# Patient Record
Sex: Female | Born: 1975 | Race: White | Hispanic: No | Marital: Married | Smoking: Former smoker
Health system: Southern US, Community
[De-identification: ages and names within clinical notes are randomized; demographics above are authoritative.]

## PROBLEM LIST (undated history)

## (undated) DIAGNOSIS — K219 Gastro-esophageal reflux disease without esophagitis: Secondary | ICD-10-CM

## (undated) DIAGNOSIS — F319 Bipolar disorder, unspecified: Secondary | ICD-10-CM

## (undated) DIAGNOSIS — J45909 Unspecified asthma, uncomplicated: Secondary | ICD-10-CM

## (undated) DIAGNOSIS — F329 Major depressive disorder, single episode, unspecified: Secondary | ICD-10-CM

## (undated) DIAGNOSIS — F32A Depression, unspecified: Secondary | ICD-10-CM

## (undated) HISTORY — PX: COLONOSCOPY: SHX174

---

## 2005-04-14 ENCOUNTER — Emergency Department: Payer: Self-pay | Admitting: Emergency Medicine

## 2006-06-13 ENCOUNTER — Observation Stay: Payer: Self-pay

## 2006-09-09 ENCOUNTER — Inpatient Hospital Stay: Payer: Self-pay | Admitting: Obstetrics and Gynecology

## 2009-04-17 ENCOUNTER — Emergency Department: Payer: Self-pay | Admitting: Emergency Medicine

## 2010-03-08 ENCOUNTER — Emergency Department: Payer: Self-pay | Admitting: Internal Medicine

## 2010-10-05 ENCOUNTER — Emergency Department: Payer: Self-pay | Admitting: Emergency Medicine

## 2011-09-14 ENCOUNTER — Emergency Department: Payer: Self-pay | Admitting: Emergency Medicine

## 2011-12-19 ENCOUNTER — Emergency Department: Payer: Self-pay | Admitting: Emergency Medicine

## 2012-03-28 ENCOUNTER — Emergency Department: Payer: Self-pay | Admitting: Emergency Medicine

## 2012-03-28 LAB — BASIC METABOLIC PANEL
Anion Gap: 5 — ABNORMAL LOW (ref 7–16)
BUN: 13 mg/dL (ref 7–18)
Co2: 28 mmol/L (ref 21–32)
Potassium: 4 mmol/L (ref 3.5–5.1)
Sodium: 137 mmol/L (ref 136–145)

## 2012-03-28 LAB — URINALYSIS, COMPLETE
Bacteria: NONE SEEN
Bilirubin,UR: NEGATIVE
Glucose,UR: NEGATIVE mg/dL (ref 0–75)
Leukocyte Esterase: NEGATIVE
Protein: NEGATIVE
Squamous Epithelial: 1
WBC UR: 3 /HPF (ref 0–5)

## 2012-03-28 LAB — HCG, QUANTITATIVE, PREGNANCY: Beta Hcg, Quant.: 131467 m[IU]/mL — ABNORMAL HIGH

## 2012-03-28 LAB — CBC
HGB: 12.6 g/dL (ref 12.0–16.0)
MCV: 90 fL (ref 80–100)
RBC: 4.23 10*6/uL (ref 3.80–5.20)
RDW: 13.6 % (ref 11.5–14.5)

## 2012-03-31 ENCOUNTER — Encounter: Payer: Self-pay | Admitting: Obstetrics and Gynecology

## 2012-05-12 ENCOUNTER — Encounter: Payer: Self-pay | Admitting: Obstetrics and Gynecology

## 2012-06-09 ENCOUNTER — Encounter: Payer: Self-pay | Admitting: Obstetrics and Gynecology

## 2012-07-21 ENCOUNTER — Encounter: Payer: Self-pay | Admitting: Obstetrics and Gynecology

## 2012-08-12 ENCOUNTER — Inpatient Hospital Stay: Payer: Self-pay | Admitting: Obstetrics and Gynecology

## 2012-08-12 LAB — DRUG SCREEN, URINE
Barbiturates, Ur Screen: NEGATIVE (ref ?–200)
Cannabinoid 50 Ng, Ur ~~LOC~~: POSITIVE (ref ?–50)
MDMA (Ecstasy)Ur Screen: NEGATIVE (ref ?–500)
Methadone, Ur Screen: NEGATIVE (ref ?–300)
Tricyclic, Ur Screen: NEGATIVE (ref ?–1000)

## 2012-08-12 LAB — CBC WITH DIFFERENTIAL/PLATELET
Basophil #: 0.1 10*3/uL (ref 0.0–0.1)
Basophil %: 0.4 %
Eosinophil #: 0.1 10*3/uL (ref 0.0–0.7)
HGB: 10.8 g/dL — ABNORMAL LOW (ref 12.0–16.0)
Lymphocyte #: 1.7 10*3/uL (ref 1.0–3.6)
MCH: 31.1 pg (ref 26.0–34.0)
MCHC: 34.4 g/dL (ref 32.0–36.0)
MCV: 90 fL (ref 80–100)
Monocyte #: 0.5 x10 3/mm (ref 0.2–0.9)
Neutrophil %: 80.1 %
Platelet: 136 10*3/uL — ABNORMAL LOW (ref 150–440)

## 2012-08-13 LAB — CBC WITH DIFFERENTIAL/PLATELET
Basophil %: 0.1 %
Eosinophil %: 0 %
HCT: 29.6 % — ABNORMAL LOW (ref 35.0–47.0)
HGB: 10.3 g/dL — ABNORMAL LOW (ref 12.0–16.0)
Lymphocyte #: 1.5 10*3/uL (ref 1.0–3.6)
MCH: 31.3 pg (ref 26.0–34.0)
MCV: 90 fL (ref 80–100)
Monocyte #: 0.8 x10 3/mm (ref 0.2–0.9)
Neutrophil #: 15.3 10*3/uL — ABNORMAL HIGH (ref 1.4–6.5)
Platelet: 146 10*3/uL — ABNORMAL LOW (ref 150–440)
RBC: 3.29 10*6/uL — ABNORMAL LOW (ref 3.80–5.20)

## 2012-08-15 LAB — CBC WITH DIFFERENTIAL/PLATELET
Bands: 1 %
Comment - H1-Com2: NORMAL
HGB: 9.3 g/dL — ABNORMAL LOW (ref 12.0–16.0)
MCH: 31.4 pg (ref 26.0–34.0)
MCHC: 34.1 g/dL (ref 32.0–36.0)
Metamyelocyte: 1 %
Monocytes: 7 %
Platelet: 126 10*3/uL — ABNORMAL LOW (ref 150–440)
RBC: 2.98 10*6/uL — ABNORMAL LOW (ref 3.80–5.20)
Segmented Neutrophils: 82 %

## 2012-08-16 ENCOUNTER — Inpatient Hospital Stay: Payer: Self-pay | Admitting: Obstetrics and Gynecology

## 2012-08-16 LAB — DRUG SCREEN, URINE
Amphetamines, Ur Screen: NEGATIVE (ref ?–1000)
Benzodiazepine, Ur Scrn: NEGATIVE (ref ?–200)
Cannabinoid 50 Ng, Ur ~~LOC~~: NEGATIVE (ref ?–50)
MDMA (Ecstasy)Ur Screen: NEGATIVE (ref ?–500)
Methadone, Ur Screen: NEGATIVE (ref ?–300)
Opiate, Ur Screen: NEGATIVE (ref ?–300)
Tricyclic, Ur Screen: NEGATIVE (ref ?–1000)

## 2012-08-16 LAB — CBC WITH DIFFERENTIAL/PLATELET
Basophil %: 0.3 %
Eosinophil #: 0.1 10*3/uL (ref 0.0–0.7)
Eosinophil %: 0.7 %
HCT: 31.7 % — ABNORMAL LOW (ref 35.0–47.0)
Lymphocyte #: 1.9 10*3/uL (ref 1.0–3.6)
MCHC: 35 g/dL (ref 32.0–36.0)
MCV: 91 fL (ref 80–100)
Monocyte #: 0.9 x10 3/mm (ref 0.2–0.9)
Monocyte %: 6.4 %
Neutrophil %: 78.8 %
Platelet: 147 10*3/uL — ABNORMAL LOW (ref 150–440)
RBC: 3.5 10*6/uL — ABNORMAL LOW (ref 3.80–5.20)
RDW: 14.2 % (ref 11.5–14.5)
WBC: 13.7 10*3/uL — ABNORMAL HIGH (ref 3.6–11.0)

## 2012-08-17 LAB — HEMATOCRIT: HCT: 24.8 % — ABNORMAL LOW (ref 35.0–47.0)

## 2013-04-15 ENCOUNTER — Emergency Department: Payer: Self-pay | Admitting: Emergency Medicine

## 2013-04-15 LAB — CBC
HCT: 36.5 % (ref 35.0–47.0)
HGB: 12.9 g/dL (ref 12.0–16.0)
MCHC: 35.3 g/dL (ref 32.0–36.0)
MCV: 84 fL (ref 80–100)
Platelet: 151 10*3/uL (ref 150–440)
RBC: 4.34 10*6/uL (ref 3.80–5.20)
RDW: 13.3 % (ref 11.5–14.5)
WBC: 8.2 10*3/uL (ref 3.6–11.0)

## 2013-04-15 LAB — URINALYSIS, COMPLETE
Bilirubin,UR: NEGATIVE
Blood: NEGATIVE
Ph: 6 (ref 4.5–8.0)
Specific Gravity: 1.016 (ref 1.003–1.030)
WBC UR: <1 /HPF (ref 0–5)

## 2013-04-15 LAB — COMPREHENSIVE METABOLIC PANEL
Albumin: 4.1 g/dL (ref 3.4–5.0)
Alkaline Phosphatase: 50 U/L (ref 50–136)
BUN: 19 mg/dL — ABNORMAL HIGH (ref 7–18)
Bilirubin,Total: 0.4 mg/dL (ref 0.2–1.0)
Calcium, Total: 8.8 mg/dL (ref 8.5–10.1)
EGFR (Non-African Amer.): >60
Glucose: 79 mg/dL (ref 65–99)
Potassium: 3.8 mmol/L (ref 3.5–5.1)
SGOT(AST): 15 U/L (ref 15–37)
SGPT (ALT): 15 U/L (ref 12–78)
Sodium: 140 mmol/L (ref 136–145)

## 2013-04-15 LAB — LIPASE, BLOOD: Lipase: 108 U/L (ref 73–393)

## 2013-05-25 ENCOUNTER — Ambulatory Visit: Payer: Self-pay | Admitting: Gastroenterology

## 2014-06-16 ENCOUNTER — Ambulatory Visit: Payer: Self-pay | Admitting: Physician Assistant

## 2015-01-18 NOTE — Op Note (Signed)
PATIENT NAME:  Kylie Schwartz, Kylie Schwartz MR#:  [id number] DATE OF BIRTH:  06/18/1976  DATE OF PROCEDURE:  08/16/2012  PREOPERATIVE DIAGNOSES:  1. Preterm premature rupture of membranes. 2. Known placenta previa.  3. Recent drug use. 4. Status post steroids this week-- was hospitalized for contractions and spotting after cocaine use.   POSTOPERATIVE DIAGNOSES:  1. Preterm premature rupture of membranes. 2. Known placenta previa.  3. Recent drug use. 4. Status post steroids this week-- was hospitalized for contractions and spotting after cocaine use.   PROCEDURE: Primary low transverse cesarean section.   SURGEON: Ricky Schwartz. [name], M.D.   ASSISTANT: Scrub Tech [facility]([place])   ANESTHESIA: General endotracheal by Dr. Henrene HawkingKephart.   FINDINGS: Grossly normal uterus, tubes, and ovaries.   ESTIMATED BLOOD LOSS: 500 mL.   COMPLICATIONS: None.   SPECIMENS: None.   DRAINS: Foley.   ANTIBIOTICS:  One gram Ancef given IV preoperatively.   PROCEDURE IN DETAIL: The patient presented today with rupture of membranes, minimal spotting. Urgent cesarean section was called. Preoperative antibiotics were given.   NOTE: Discussed the situation with the patient and family. Offered to do tubal ligation free of charge given papers had not been done- the patient initially said she wanted to but then changed her mind with  planned Depo. I discussed the increased chance of hysterectomy and/or excessive blood loss in the setting of placenta previa, anterior. They stated their understanding. Consent was signed.  She was taken to the operating room and placed in the supine position where she was prepped and draped and Foley catheter was inserted. Team was ready. Time-out was done and anesthesia was induced. On okay from anesthesia a rapid cesarean section was carried out in the usual fashion using a #10 blade. A low transverse incision was felt to be possible as it was developed in the lower uterine segment. This was carried out  in the usual fashion cutting through placenta. A forebag was ruptured and the infant delivered without difficulty and handed off to anesthesia for assessment. There was initial vigorous cry. The cord was cut for cord gas. Some cord blood was obtained but was minimal as we had to cut through the placenta to reach the uterine cavity. Asked the crew to try to draw some extra blood out of the cord.   Uterus was exteriorized. The placenta was removed. The uterine cavity was curettaged. It was seen that the transverse incision was more mid uterine given preterm status.  I was still able to close the incision with a single layer running interlocking 0 chromic with some 4-0 Monocryl for surface oozers and a small hematoma on the right. It was observed for several and minutes seen to be hemostatic. Copious fluids were evacuated from the abdominal cavity. The uterus was returned to the abdominal cavity and inspected for hemostasis, which was seen to be excellent. Rectus muscle was hemostatic. Fascia was closed left to right with 0 Vicryl. Subcutaneous was hemostatic with cautery. The skin was closed with surgical clips.   The patient tolerated the procedure well. Sterile dressing was applied. I anticipate a routine postoperative course.   I would recommend repeat cesarean section with any subsequent pregnancies given that although it was transverse incision, it did not appear to be in the "lower uterine segment ".  ____________________________ [name]. [name], MD rle:bjt D: 08/16/2012 12:30:59 ET T: 08/16/2012 14:19:18 ET JOB#: [id number] Adin Lariccia Schwartz Vedha Tercero MD ELECTRONICALLY SIGNED 08/17/2012 3:26

## 2015-01-18 NOTE — Discharge Summary (Signed)
PATIENT NAME:  Kylie Schwartz, Kylie Schwartz MR#:  [id number] DATE OF BIRTH:  11/20/1975  DATE OF ADMISSION:  08/12/2012 DATE OF DISCHARGE:  08/15/2012  HOSPITAL COURSE: The patient is a 39 year old female gravida 7 para 3 at 6531 + 2 weeks admitted with vaginal bleeding and noted to have a placenta previa. The patient had previously engaged in intercourse two days prior and has been actively using drugs, marijuana and cocaine positive on urine drug screen on admission. The patient was admitted and placed on magnesium sulfate for tocolysis, received betamethasone 12.5 mg intramuscular repeated 24 hours later. Hospital day #4 the patient's hematocrit was 27.4%, platelets of 126. The patient had a nonstress test on day of discharge, only some brownish discharge, no contractions, reactive nonstress test. The patient is discharged to home. She has been counseled several times regarding any sexual activity, pelvic rest. She should be at modified activity and is instructed to return to the clinic if she has any significant bleeding, contractions, or abdominal pain. She will be started on ferrous sulfate 325 mg 1 tablet twice a day with Vitamin C. The patient will have nonstress test two times per week at [facility]. She will have a follow-up appointment with [facility].   ____________________________ Suzy Bouchardhomas J. Sophira Rumler, MD tjs:drc D: 08/15/2012 16:23:03 ET T: 08/18/2012 09:45:15 ET JOB#: [id number]  cc: Suzy Bouchardhomas J. Filiberto Wamble, MD, <Dictator> [facility] Suzy BouchardHOMAS J Faige Seely MD ELECTRONICALLY SIGNED 08/19/2012 9:58

## 2015-01-18 NOTE — Discharge Summary (Signed)
PATIENT NAME:  Kylie Schwartz, Kylie Schwartz MR#:  [id number] DATE OF BIRTH:  1976-01-28  DATE OF ADMISSION:  08/16/2012 DATE OF DISCHARGE:  08/19/2012  ADMISSION DIAGNOSIS: Preterm premature rupture of membranes and placenta previa.   DISCHARGE DIAGNOSIS: Preterm premature rupture of membranes and placenta previa.   PROCEDURE: Cesarean section.   COMPLICATIONS: None.   CONSULTANTS: Anesthesia for surgery.   HOSPITAL COURSE: The patient did well, afebrile throughout, and was discharged home on the morning of day three with routine prescriptions, precautions, and follow-up.   The patient was offered tubal ligation and she declined.  ____________________________ [name]. [name], MD rle:slb D: 08/23/2012 10:20:42 ET T: 08/24/2012 13:25:08 ET JOB#: [id number]  cc: [name]. [name], MD, <Dictator> [name] [name] MD ELECTRONICALLY SIGNED 08/25/2012 10:28

## 2015-02-08 NOTE — H&P (Signed)
L&D Evaluation:  History:   HPI 39 yo G7P3 at 31+2 weeks with known placenta previa comes in with BR vag bleeding. . Pt admits to cocaine + MJ use recently in passt 3 days . vag intercourse 2 days ago.    Presents with vaginal bleeding    Patient's Medical History Asthma  drug use    Patient's Surgical History none    Medications Pre Natal Vitamins  albuterol    Allergies NKDA    Social History tobacco  drugs   ROS:   ROS All systems were reviewed.  HEENT, CNS, GI, GU, Respiratory, CV, Renal and Musculoskeletal systems were found to be normal.   Exam:   Vital Signs stable  109/60    Mental Status clear    Chest clear    Heart normal sinus rhythm    Abdomen gravid, non-tender    Pelvic gentle digital: exam cervix closed / 50% blood on glove    FHT normal rate with no decels    Fetal Heart Rate 130    Ucx irritability   Impression:   Impression 31 week with vaginal bleeding and placental previa. reassurring FHR. At risk pregnancy with disregard to intercourse with known previa and drug use recently   Plan:   Plan admit. mag Sulfate 2 gm/ hr. Beta methasone 12.5 IM x 2 q 24hrs. HCT. Social services consult. cont FHM.   Electronic Signatures: Marcelina Mclaurin, Ihor Austinhomas J (MD)  (Signed [phone]-Nov-13 11:53)  Authored: L&D Evaluation   Last Updated: 12-Nov-13 11:53 by Suzy BouchardSchermerhorn, Kentrel Clevenger J (MD)

## 2015-07-27 ENCOUNTER — Other Ambulatory Visit: Payer: Self-pay | Admitting: Psychiatry

## 2015-07-27 DIAGNOSIS — J45909 Unspecified asthma, uncomplicated: Secondary | ICD-10-CM

## 2015-07-27 DIAGNOSIS — F4310 Post-traumatic stress disorder, unspecified: Secondary | ICD-10-CM

## 2015-08-29 ENCOUNTER — Other Ambulatory Visit: Payer: Self-pay | Admitting: Obstetrics and Gynecology

## 2015-08-29 DIAGNOSIS — F4310 Post-traumatic stress disorder, unspecified: Secondary | ICD-10-CM

## 2015-08-29 DIAGNOSIS — J45909 Unspecified asthma, uncomplicated: Secondary | ICD-10-CM

## 2015-08-30 ENCOUNTER — Ambulatory Visit
Admission: RE | Admit: 2015-08-30 | Discharge: 2015-08-30 | Disposition: A | Discharge status: Home / Self Care | Payer: Disability Insurance | Source: Ambulatory Visit | Attending: Obstetrics and Gynecology | Admitting: Obstetrics and Gynecology

## 2015-08-30 ENCOUNTER — Ambulatory Visit: Payer: Disability Insurance

## 2015-08-30 ENCOUNTER — Other Ambulatory Visit: Payer: Self-pay | Admitting: Obstetrics and Gynecology

## 2015-08-30 ENCOUNTER — Ambulatory Visit: Payer: Self-pay

## 2015-08-30 DIAGNOSIS — M549 Dorsalgia, unspecified: Secondary | ICD-10-CM

## 2015-08-30 DIAGNOSIS — M542 Cervicalgia: Secondary | ICD-10-CM | POA: Insufficient documentation

## 2015-09-01 ENCOUNTER — Other Ambulatory Visit: Payer: Self-pay | Admitting: Obstetrics and Gynecology

## 2015-09-01 DIAGNOSIS — J45909 Unspecified asthma, uncomplicated: Secondary | ICD-10-CM

## 2015-09-06 ENCOUNTER — Ambulatory Visit: Payer: Disability Insurance | Attending: Obstetrics and Gynecology

## 2015-09-06 DIAGNOSIS — J45909 Unspecified asthma, uncomplicated: Secondary | ICD-10-CM | POA: Diagnosis not present

## 2016-03-01 ENCOUNTER — Encounter
Admission: RE | Admit: 2016-03-01 | Discharge: 2016-03-01 | Disposition: A | Discharge status: Home / Self Care | Payer: Medicaid Other | Source: Ambulatory Visit | Attending: Podiatry | Admitting: Podiatry

## 2016-03-01 DIAGNOSIS — Z01812 Encounter for preprocedural laboratory examination: Secondary | ICD-10-CM | POA: Insufficient documentation

## 2016-03-01 HISTORY — DX: Unspecified asthma, uncomplicated: J45.909

## 2016-03-01 HISTORY — DX: Bipolar disorder, unspecified: F31.9

## 2016-03-01 HISTORY — DX: Gastro-esophageal reflux disease without esophagitis: K21.9

## 2016-03-01 HISTORY — DX: Major depressive disorder, single episode, unspecified: F32.9

## 2016-03-01 HISTORY — DX: Depression, unspecified: F32.A

## 2016-03-01 LAB — CBC
HCT: 36 % (ref 35.0–47.0)
HEMOGLOBIN: 12.3 g/dL (ref 12.0–16.0)
MCH: 29.3 pg (ref 26.0–34.0)
MCHC: 34.1 g/dL (ref 32.0–36.0)
MCV: 85.9 fL (ref 80.0–100.0)
Platelets: 158 10*3/uL (ref 150–440)
RBC: 4.19 MIL/uL (ref 3.80–5.20)
RDW: 13.7 % (ref 11.5–14.5)
WBC: 7 10*3/uL (ref 3.6–11.0)

## 2016-03-01 LAB — DIFFERENTIAL
BASOS ABS: 0 10*3/uL (ref 0–0.1)
Basophils Relative: 1 %
Eosinophils Absolute: 0.1 10*3/uL (ref 0–0.7)
Eosinophils Relative: 2 %
LYMPHS ABS: 2 10*3/uL (ref 1.0–3.6)
LYMPHS PCT: 29 %
Monocytes Absolute: 0.4 10*3/uL (ref 0.2–0.9)
Monocytes Relative: 5 %
NEUTROS PCT: 63 %
Neutro Abs: 4.4 10*3/uL (ref 1.4–6.5)

## 2016-03-01 NOTE — Patient Instructions (Signed)
Your procedure is scheduled on: Friday 03/09/16 Report to Day Surgery. 2ND FLOOR MEDICAL MALL ENTRANCE To find out your arrival time please call [phone] between 1PM - 3PM on Thursday 03/08/16.  Remember: Instructions that are not followed completely may result in serious medical risk, up to and including death, or upon the discretion of your surgeon and anesthesiologist your surgery may need to be rescheduled.    __X__ 1. Do not eat food or drink liquids after midnight. No gum chewing or hard candies.     __X__ 2. No Alcohol for 24 hours before or after surgery.   ____ 3. Bring all medications with you on the day of surgery if instructed.    __X__ 4. Notify your doctor if there is any change in your medical condition     (cold, fever, infections).     Do not wear jewelry, make-up, hairpins, clips or nail polish.  Do not wear lotions, powders, or perfumes.   Do not shave 48 hours prior to surgery. Men may shave face and neck.  Do not bring valuables to the hospital.    [facility] is not responsible for any belongings or valuables.               Contacts, dentures or bridgework may not be worn into surgery.  Leave your suitcase in the car. After surgery it may be brought to your room.  For patients admitted to the hospital, discharge time is determined by your                treatment team.   Patients discharged the day of surgery will not be allowed to drive home.   Please read over the following fact sheets that you were given:   Surgical Site Infection Prevention   __X__ Take these medicines the morning of surgery with A SIP OF WATER:    1. CLONIDINE  2. BUSPIRONE  3. NEXIUM  4. LAMOTRIGINE  5.  6.  ____ Fleet Enema (as directed)   __X__ Use CHG Soap as directed  __X__ Use inhalers on the day of surgery AND BRING DAY OF SURGERY  ____ Stop metformin 2 days prior to surgery    ____ Take 1/2 of usual insulin dose the night before surgery and none on the morning of  surgery.   ____ Stop Coumadin/Plavix/aspirin on   __X__ Stop Anti-inflammatories on STOP IBUPROFEN UNTIL AFTER SURGERY   ____ Stop supplements until after surgery.    ____ Bring C-Pap to the hospital.

## 2016-03-09 ENCOUNTER — Ambulatory Visit: Payer: Medicaid Other | Admitting: Anesthesiology

## 2016-03-09 ENCOUNTER — Encounter: Payer: Self-pay | Admitting: *Deleted

## 2016-03-09 ENCOUNTER — Encounter
Admission: RE | Disposition: A | Discharge status: Home / Self Care | Payer: Self-pay | Source: Ambulatory Visit | Attending: Podiatry

## 2016-03-09 ENCOUNTER — Ambulatory Visit
Admission: RE | Admit: 2016-03-09 | Discharge: 2016-03-09 | Disposition: A | Discharge status: Home / Self Care | Payer: Medicaid Other | Source: Ambulatory Visit | Attending: Podiatry | Admitting: Podiatry

## 2016-03-09 DIAGNOSIS — Z87891 Personal history of nicotine dependence: Secondary | ICD-10-CM | POA: Insufficient documentation

## 2016-03-09 DIAGNOSIS — K219 Gastro-esophageal reflux disease without esophagitis: Secondary | ICD-10-CM | POA: Insufficient documentation

## 2016-03-09 DIAGNOSIS — S92421P Displaced fracture of distal phalanx of right great toe, subsequent encounter for fracture with malunion: Secondary | ICD-10-CM | POA: Insufficient documentation

## 2016-03-09 DIAGNOSIS — F319 Bipolar disorder, unspecified: Secondary | ICD-10-CM | POA: Diagnosis not present

## 2016-03-09 DIAGNOSIS — F329 Major depressive disorder, single episode, unspecified: Secondary | ICD-10-CM | POA: Insufficient documentation

## 2016-03-09 DIAGNOSIS — J45909 Unspecified asthma, uncomplicated: Secondary | ICD-10-CM | POA: Diagnosis not present

## 2016-03-09 DIAGNOSIS — S92911A Unspecified fracture of right toe(s), initial encounter for closed fracture: Secondary | ICD-10-CM | POA: Diagnosis present

## 2016-03-09 HISTORY — PX: OSTECTOMY: SHX6439

## 2016-03-09 LAB — POCT PREGNANCY, URINE: Preg Test, Ur: NEGATIVE

## 2016-03-09 SURGERY — OSTECTOMY
Anesthesia: General | Site: Foot | Laterality: Right | Wound class: Clean

## 2016-03-09 MED ORDER — BUPIVACAINE HCL (PF) 0.5 % IJ SOLN
INTRAMUSCULAR | Status: DC | PRN
  Administered 2016-03-09: 5 mL

## 2016-03-09 MED ORDER — BUPIVACAINE HCL (PF) 0.5 % IJ SOLN
INTRAMUSCULAR | Status: AC
  Filled 2016-03-09: qty 30

## 2016-03-09 MED ORDER — FENTANYL CITRATE (PF) 100 MCG/2ML IJ SOLN: 25.0000 ug | INTRAMUSCULAR | Status: DC | PRN

## 2016-03-09 MED ORDER — LIDOCAINE HCL (PF) 1 % IJ SOLN
INTRAMUSCULAR | Status: AC
  Filled 2016-03-09: qty 30

## 2016-03-09 MED ORDER — MIDAZOLAM HCL 2 MG/2ML IJ SOLN
INTRAMUSCULAR | Status: DC | PRN
  Administered 2016-03-09: 2 mg via INTRAVENOUS

## 2016-03-09 MED ORDER — LACTATED RINGERS IV SOLN
INTRAVENOUS | Status: DC
  Administered 2016-03-09: 09:00:00 via INTRAVENOUS

## 2016-03-09 MED ORDER — PROPOFOL 500 MG/50ML IV EMUL
INTRAVENOUS | Status: DC | PRN
  Administered 2016-03-09: 75 ug/kg/min via INTRAVENOUS

## 2016-03-09 MED ORDER — ACETAMINOPHEN 10 MG/ML IV SOLN
INTRAVENOUS | Status: DC | PRN
  Administered 2016-03-09: 1000 mg via INTRAVENOUS

## 2016-03-09 MED ORDER — FENTANYL CITRATE (PF) 100 MCG/2ML IJ SOLN
INTRAMUSCULAR | Status: DC | PRN
  Administered 2016-03-09 (×4): 25 ug via INTRAVENOUS

## 2016-03-09 MED ORDER — ONDANSETRON HCL 4 MG/2ML IJ SOLN
INTRAMUSCULAR | Status: DC | PRN
  Administered 2016-03-09: 4 mg via INTRAVENOUS

## 2016-03-09 MED ORDER — HYDROCODONE-ACETAMINOPHEN 5-325 MG PO TABS: 1.0000 | ORAL_TABLET | ORAL | Status: AC | PRN

## 2016-03-09 MED ORDER — DEXAMETHASONE SODIUM PHOSPHATE 10 MG/ML IJ SOLN
INTRAMUSCULAR | Status: DC | PRN
  Administered 2016-03-09: 10 mg via INTRAVENOUS

## 2016-03-09 MED ORDER — LIDOCAINE HCL (CARDIAC) 20 MG/ML IV SOLN
INTRAVENOUS | Status: DC | PRN
  Administered 2016-03-09: 50 mg via INTRAVENOUS

## 2016-03-09 MED ORDER — OXYCODONE HCL 5 MG/5ML PO SOLN: 5.0000 mg | Freq: Once | ORAL | Status: DC | PRN

## 2016-03-09 MED ORDER — PROPOFOL 10 MG/ML IV BOLUS
INTRAVENOUS | Status: DC | PRN
  Administered 2016-03-09: 10 mg via INTRAVENOUS
  Administered 2016-03-09: 20 mg via INTRAVENOUS
  Administered 2016-03-09: 10 mg via INTRAVENOUS

## 2016-03-09 MED ORDER — ACETAMINOPHEN 10 MG/ML IV SOLN
INTRAVENOUS | Status: AC
  Filled 2016-03-09: qty 100

## 2016-03-09 MED ORDER — OXYCODONE HCL 5 MG PO TABS: 5.0000 mg | ORAL_TABLET | Freq: Once | ORAL | Status: DC | PRN

## 2016-03-09 SURGICAL SUPPLY — 31 items
BAG COUNTER SPONGE EZ (MISCELLANEOUS) IMPLANT
BANDAGE STRETCH 3X4.1 STRL (GAUZE/BANDAGES/DRESSINGS) ×3 IMPLANT
BNDG ESMARK 4X12 TAN STRL LF (GAUZE/BANDAGES/DRESSINGS) ×3 IMPLANT
BNDG GAUZE 4.5X4.1 6PLY STRL (MISCELLANEOUS) ×3 IMPLANT
CLOSURE WOUND 1/4X4 (GAUZE/BANDAGES/DRESSINGS) ×1
COUNTER SPONGE BAG EZ (MISCELLANEOUS)
CUFF TOURN DUAL PL 12 NO SLV (MISCELLANEOUS) ×3 IMPLANT
DRAPE FLUOR MINI C-ARM 54X84 (DRAPES) ×3 IMPLANT
DRSG TEGADERM 4X4.75 (GAUZE/BANDAGES/DRESSINGS) IMPLANT
DURAPREP 26ML APPLICATOR (WOUND CARE) ×3 IMPLANT
ELECT REM PT RETURN 9FT ADLT (ELECTROSURGICAL) ×3
ELECTRODE REM PT RTRN 9FT ADLT (ELECTROSURGICAL) ×1 IMPLANT
GAUZE PETRO XEROFOAM 1X8 (MISCELLANEOUS) ×3 IMPLANT
GAUZE SPONGE 4X4 12PLY STRL (GAUZE/BANDAGES/DRESSINGS) ×3 IMPLANT
GAUZE STRETCH 2X75IN STRL (MISCELLANEOUS) ×3 IMPLANT
GLOVE BIO SURGEON STRL SZ7.5 (GLOVE) ×3 IMPLANT
GLOVE INDICATOR 8.0 STRL GRN (GLOVE) ×3 IMPLANT
GOWN STRL REUS W/ TWL LRG LVL3 (GOWN DISPOSABLE) ×1 IMPLANT
GOWN STRL REUS W/TWL LRG LVL3 (GOWN DISPOSABLE) ×2
KIT RM TURNOVER STRD PROC AR (KITS) ×3 IMPLANT
LABEL OR SOLS (LABEL) ×3 IMPLANT
NDL SAFETY 18GX1.5 (NEEDLE) ×3 IMPLANT
NEEDLE HYPO 25X1 1.5 SAFETY (NEEDLE) ×6 IMPLANT
NS IRRIG 500ML POUR BTL (IV SOLUTION) ×3 IMPLANT
PACK EXTREMITY ARMC (MISCELLANEOUS) ×3 IMPLANT
PENCIL ELECTRO HAND CTR (MISCELLANEOUS) ×3 IMPLANT
STOCKINETTE M/LG 89821 (MISCELLANEOUS) ×3 IMPLANT
STRIP CLOSURE SKIN 1/4X4 (GAUZE/BANDAGES/DRESSINGS) ×2 IMPLANT
SURGICAL BLADE HAVEL'S ×3 IMPLANT
SUT VIC AB 4-0 FS2 27 (SUTURE) ×3 IMPLANT
SUT VICRYL AB 3-0 FS1 BRD 27IN (SUTURE) IMPLANT

## 2016-03-09 NOTE — Interval H&P Note (Signed)
History and Physical Interval Note:  03/09/2016 9:15 AM  Ranae L Genella RifeGuerrero  has presented today for surgery, with the diagnosis of s92.4215  The various methods of treatment have been discussed with the patient and family. After consideration of risks, benefits and other options for treatment, the patient has consented to  Procedure(s): OSTECTOMY/1st great toe (Right) as a surgical intervention .  The patient's history has been reviewed, patient examined, no change in status, stable for surgery.  I have reviewed the patient's chart and labs.  Questions were answered to the patient's satisfaction.     Ricci Barkerodd W Meigan Pates

## 2016-03-09 NOTE — Transfer of Care (Signed)
Immediate Anesthesia Transfer of Care Note  Patient: Kylie Schwartz  Procedure(s) Performed: Procedure(s): OSTECTOMY/1st great toe (Right)  Patient Location: PACU  Anesthesia Type:General  Level of Consciousness: awake, alert  and oriented  Airway & Oxygen Therapy: Patient Spontanous Breathing  Post-op Assessment: Report given to RN and Post -op Vital signs reviewed and stable  Post vital signs: Reviewed and stable  Last Vitals:  Filed Vitals:   03/09/16 0840 03/09/16 1017  BP: 94/59 96/63  Pulse: 74 74  Temp: 36.9 C 36.8 C  Resp: 18 13    Last Pain: There were no vitals filed for this visit.       Complications: No apparent anesthesia complications

## 2016-03-09 NOTE — Anesthesia Procedure Notes (Signed)
Date/Time: 03/09/2016 9:28 AM Performed by: Ginger CarneMICHELET, Laurren Lepkowski Pre-anesthesia Checklist: Patient identified, Emergency Drugs available, Suction available, Patient being monitored and Timeout performed Patient Re-evaluated:Patient Re-evaluated prior to inductionOxygen Delivery Method: Simple face mask Preoxygenation: Pre-oxygenation with 100% oxygen

## 2016-03-09 NOTE — Discharge Instructions (Addendum)
1. Elevate the right lower extremity on 2 pillows.  2. Keep the bandage on the right foot clean, dry, and do not remove.  3. Sponge bathe only right lower extremity.  4. Wear surgical shoe on the right foot whenever walking or standing.  5. Take one pain pill, Norco, every 4 hours as needed for pain.  AMBULATORY SURGERY  DISCHARGE INSTRUCTIONS   1) The drugs that you were given will stay in your system until tomorrow so for the next 24 hours you should not:  A) Drive an automobile B) Make any legal decisions C) Drink any alcoholic beverage   2) You may resume regular meals tomorrow.  Today it is better to start with liquids and gradually work up to solid foods.  You may eat anything you prefer, but it is better to start with liquids, then soup and crackers, and gradually work up to solid foods.   3) Please notify your doctor immediately if you have any unusual bleeding, trouble breathing, redness and pain at the surgery site, drainage, fever, or pain not relieved by medication.    4) Additional Instructions:        Please contact your physician with any problems or Same Day Surgery at [phone], Monday through Friday 6 am to 4 pm, or [place] [facility] Main number at [phone].

## 2016-03-09 NOTE — Anesthesia Preprocedure Evaluation (Signed)
Anesthesia Evaluation  Patient identified by MRN, date of birth, ID band Patient awake    Reviewed: Allergy & Precautions, H&P , NPO status , Patient's Chart, lab work & pertinent test results  History of Anesthesia Complications Negative for: history of anesthetic complications  Airway Mallampati: III  TM Distance: >3 FB Neck ROM: full    Dental  (+) Poor Dentition, Missing, Chipped, Partial Upper   Pulmonary neg shortness of breath, asthma , former smoker,    Pulmonary exam normal breath sounds clear to auscultation       Cardiovascular Exercise Tolerance: Good (-) angina(-) Past MI and (-) DOE negative cardio ROS Normal cardiovascular exam Rhythm:regular Rate:Normal     Neuro/Psych PSYCHIATRIC DISORDERS Depression Bipolar Disorder negative neurological ROS     GI/Hepatic Neg liver ROS, GERD  Controlled,  Endo/Other  negative endocrine ROS  Renal/GU negative Renal ROS  negative genitourinary   Musculoskeletal   Abdominal   Peds  Hematology negative hematology ROS (+)   Anesthesia Other Findings Past Medical History:   Bipolar disorder (HCC)                                       Asthma                                                       GERD (gastroesophageal reflux disease)                       Depression                                                  Past Surgical History:   CESAREAN SECTION                                              COLONOSCOPY                                                   CESAREAN SECTION                                                Reproductive/Obstetrics negative OB ROS                             Anesthesia Physical Anesthesia Plan  ASA: III  Anesthesia Plan: General   Post-op Pain Management:    Induction:   Airway Management Planned:   Additional Equipment:   Intra-op Plan:   Post-operative Plan:   Informed Consent: I have  reviewed the patients History and Physical, chart, labs and discussed the procedure including the risks, benefits and alternatives for the proposed  anesthesia with the patient or authorized representative who has indicated his/her understanding and acceptance.   Dental Advisory Given  Plan Discussed with: Anesthesiologist, CRNA and Surgeon  Anesthesia Plan Comments:         Anesthesia Quick Evaluation

## 2016-03-09 NOTE — Op Note (Signed)
Date of operation: 03/09/2016.  Surgeon: Ricci Barkerodd W Kenyan Karnes DPM.  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  Postoperative diagnosis: Same.  Procedure: Excision fracture fragment distal phalanx right hallux.  Anesthesia: Local Mac.  Hemostasis: Pneumatic tourniquet right ankle 250 mmHg.  Estimated blood loss: Less than 5 cc.  Pathology: None.  Materials: None.   Patient's: None apparent.  Operative indications: This is a 40 year old female with a history of a fracture to her right great toe. She has continued to have pain in the joint with a malunion of the fracture and she elects to have surgical removal.  Operative procedure: Patient was taken to the operating room and placed on the table in the supine position. Going satisfactory sedation the right great toe was anesthetized with 5 cc of 0.5% Sensorcaine plain. A pneumatic tourniquet was applied at the level of the right ankle and the foot was prepped and draped in the usual sterile fashion. The foot was exsanguinated and the tourniquet inflated to 250 mmHg. Attention was directed to the dorsal aspect of the right great toe where an approximate 2.5 cm linear incision was made coursing proximal to distal over the interphalangeal joint. The incision was deepened via sharp and blunt dissection down to the level of the bone where a linear periosteal and capsular incision was made. Capsular periosteal tissues reflected off of the head of the proximal phalanx and base of the distal phalanx. The prominent dorsal bone was identified. It was fairly well healed to the main body of the bone. The fragment was then removed using a ronguer to remove the dorsal base of the distal phalanx. The remaining raw bony edges were rasped smooth. On range of motion there was no recurrent crepitus noted. Intraoperative FluoroScan views revealed good reduction of the bony deformity. The wound was flushed with copious amounts of  sterile saline and closed using 4-0 Vicryl running suture for all layers from capsular and periosteal closure to deep and superficial subcutaneous closure and skin closure. Tincture of benzoin and Steri-Strips applied followed by Xeroform and a sterile bandage. Tourniquet released and blood flow noted to return immediately to the right foot and all digits. The patient tolerated the procedure and anesthesia well and was transported to the PACU with vital signs stable and in good condition.

## 2016-03-09 NOTE — H&P (Signed)
  Medical history physical and chart was reviewed. No interval changes. Patient stable for surgery

## 2016-03-09 NOTE — Anesthesia Postprocedure Evaluation (Signed)
Anesthesia Post Note  Patient: Kylie Schwartz  Procedure(s) Performed: Procedure(s) (LRB): OSTECTOMY/1st great toe (Right)  Patient location during evaluation: PACU Anesthesia Type: General Level of consciousness: awake and alert Pain management: pain level controlled Vital Signs Assessment: post-procedure vital signs reviewed and stable Respiratory status: spontaneous breathing, nonlabored ventilation, respiratory function stable and patient connected to nasal cannula oxygen Cardiovascular status: blood pressure returned to baseline and stable Postop Assessment: no signs of nausea or vomiting Anesthetic complications: no    Last Vitals:  Filed Vitals:   03/09/16 1047 03/09/16 1058  BP: 102/69 96/61  Pulse: 66 60  Temp: 36.6 C 35.9 C  Resp: 12 14    Last Pain:  Filed Vitals:   03/09/16 1058  PainSc: 0-No pain                 Jomarie LongsJoseph K Marquies Wanat

## 2016-11-05 IMAGING — CR DG LUMBAR SPINE 2-3V
3 series · 3 of 3 positions shown · non-contrast
Comparison: None

CLINICAL DATA: Back pain, injured lifting 2 years ago, re-injured 2
days ago,

EXAM:
LUMBAR SPINE - 2-3 VIEW

[l-spine ap]
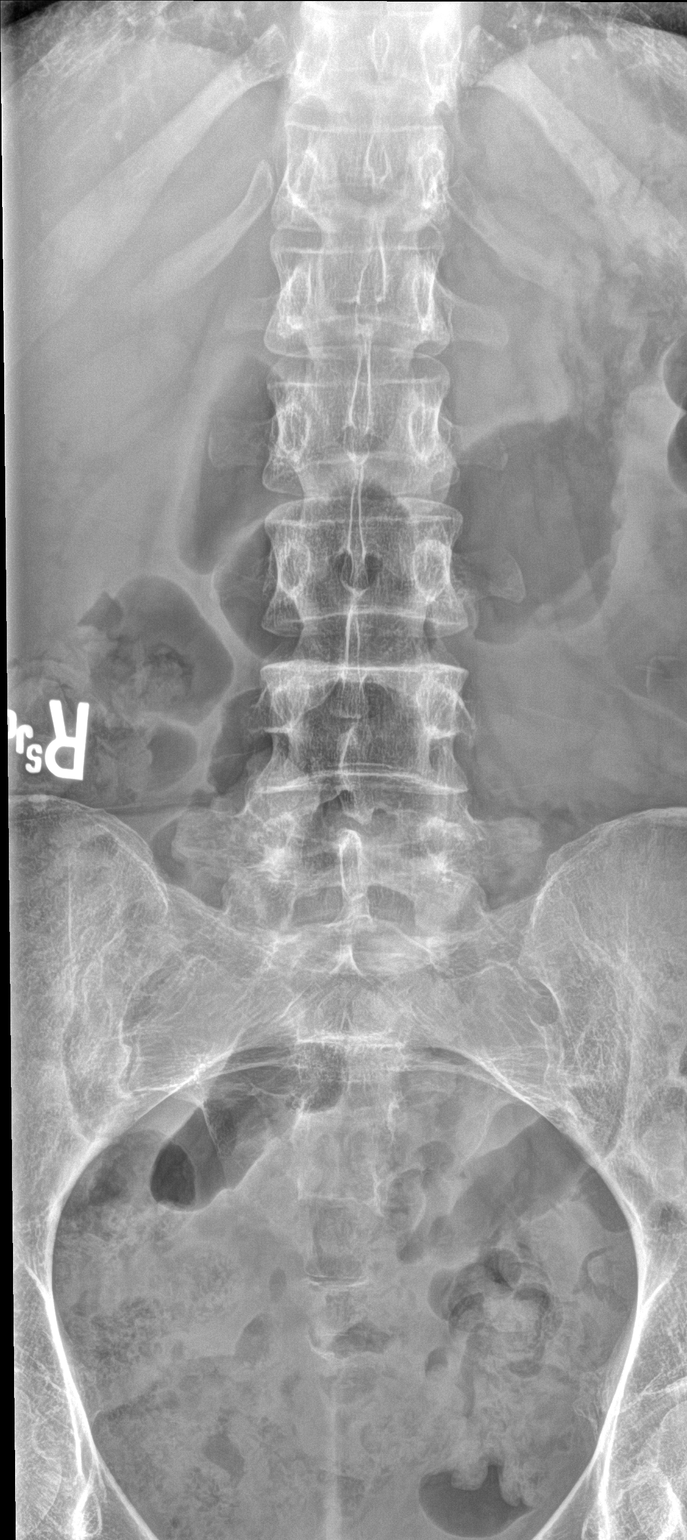

[l-spine lat]
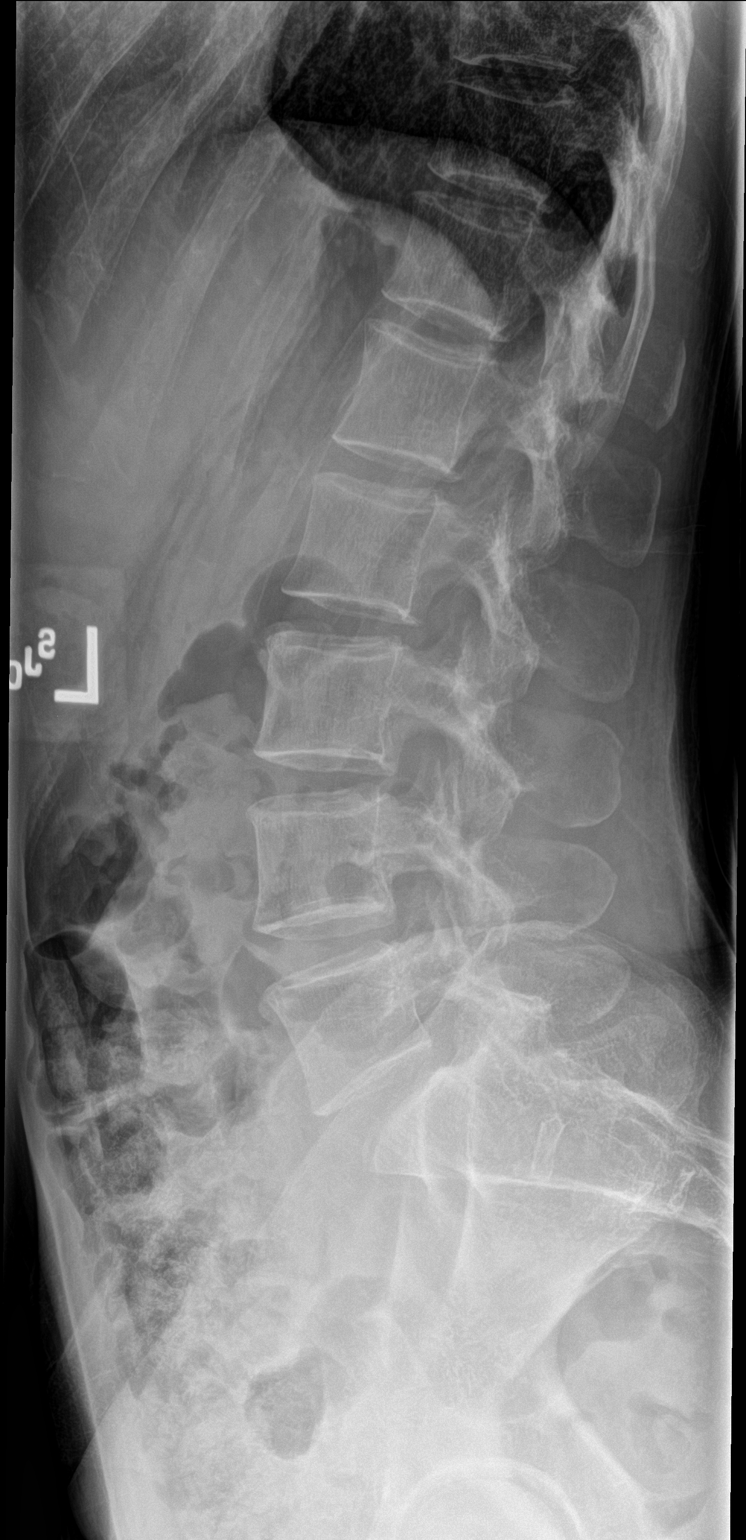

[l-spine spot]
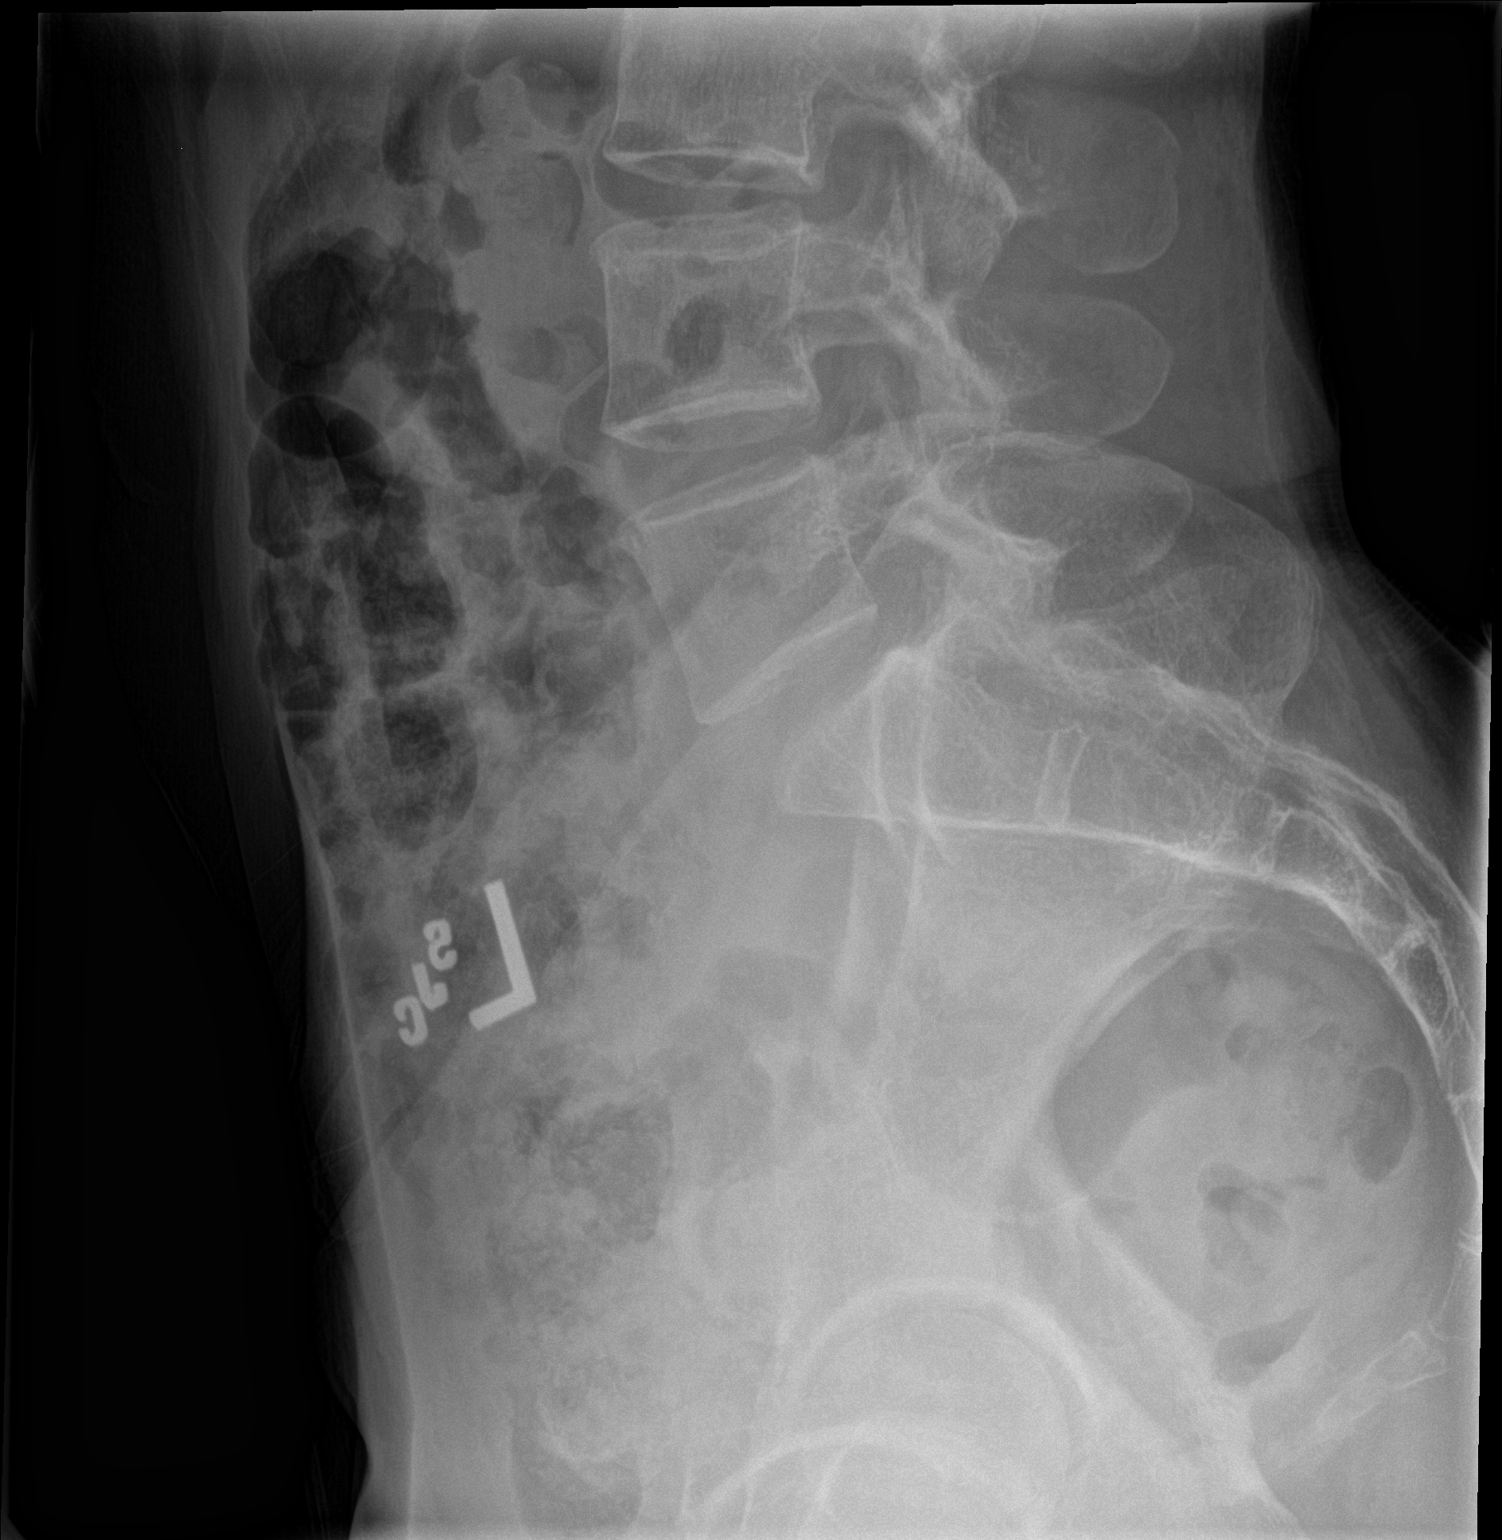

[3 of 3 positions shown; findings below may reference images not displayed]

FINDINGS: The lumbar vertebrae are in normal alignment. Intervertebral disc
spaces appear normal. No compression deformity is seen. No
significant degenerative change is noted. The SI joints are well
corticated.
IMPRESSION: Negative.

## 2016-11-05 IMAGING — CR DG THORACIC SPINE 2V
2 series · 2 of 2 positions shown · non-contrast
Comparison: None.

CLINICAL DATA: Back pain, injured 2 years ago lifting, re-injured
recently

EXAM:
THORACIC SPINE 2 VIEWS

[t-spine ap]
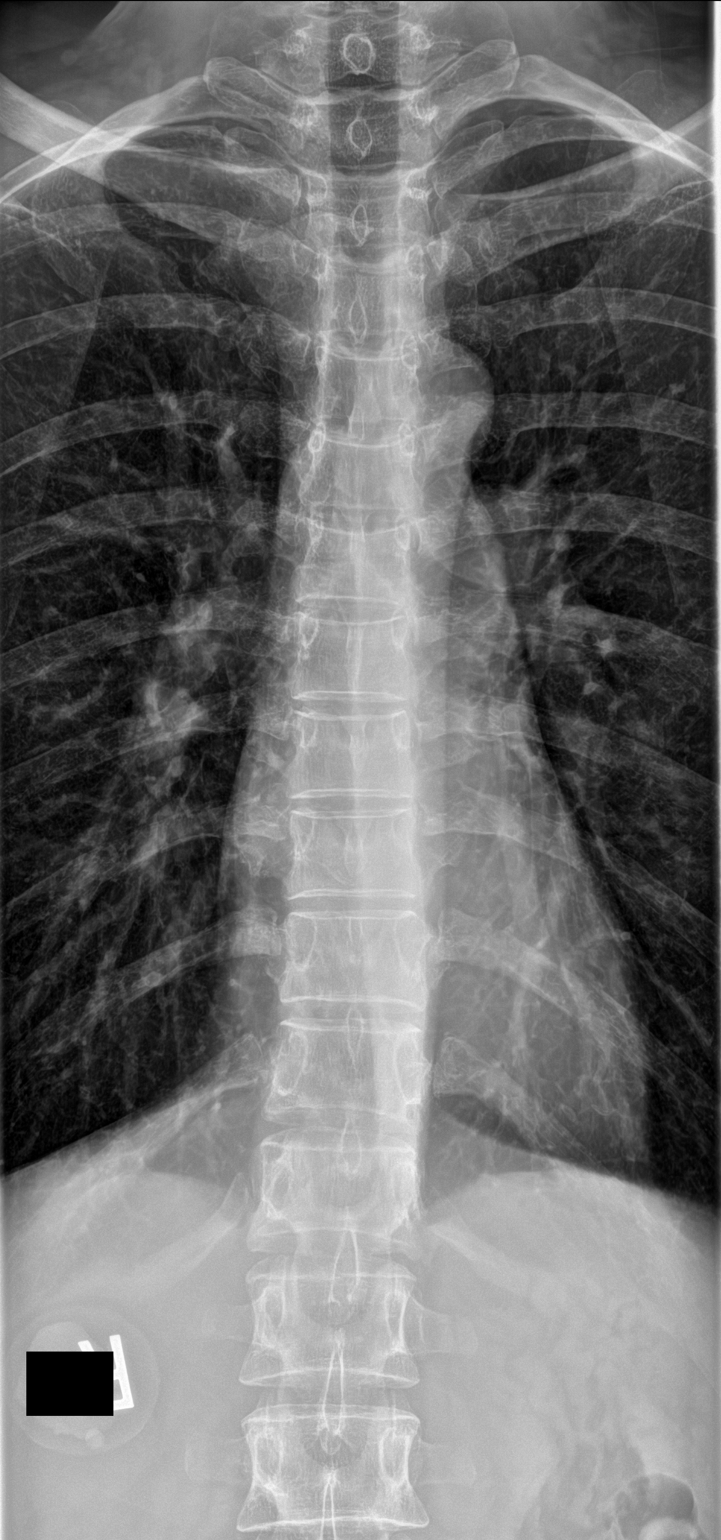

[t-spine lat]
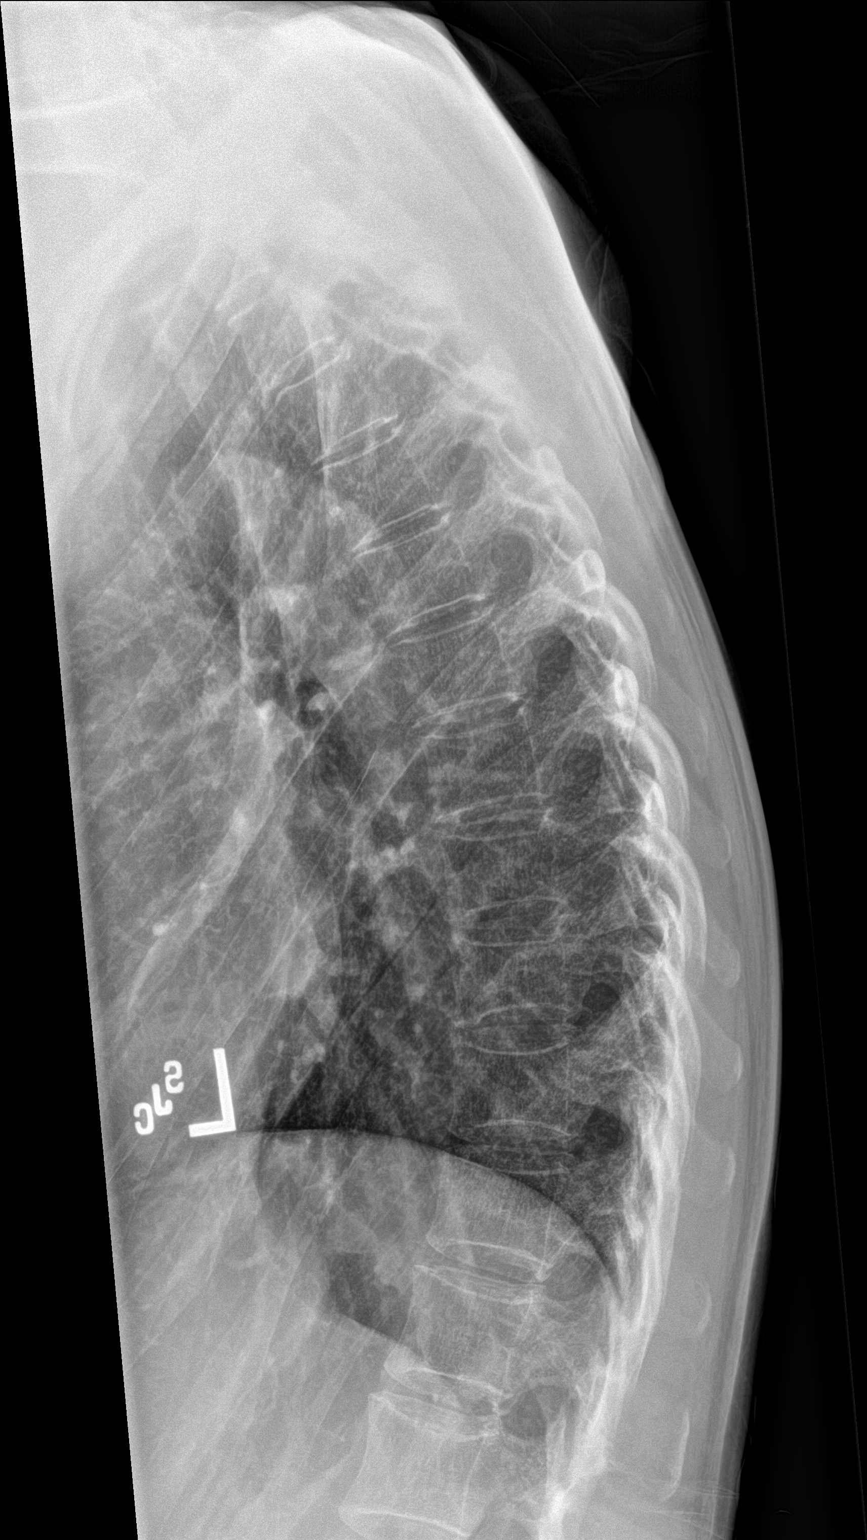

[2 of 2 positions shown; findings below may reference images not displayed]

FINDINGS: The thoracic vertebrae are in normal alignment. Intervertebral disc
spaces appear normal. No compression deformity is seen. No prominent
paravertebral soft tissue is noted.
IMPRESSION: Negative.

## 2017-10-23 ENCOUNTER — Encounter: Payer: Self-pay | Admitting: Emergency Medicine

## 2017-10-23 ENCOUNTER — Other Ambulatory Visit: Payer: Self-pay

## 2017-10-23 ENCOUNTER — Emergency Department
Admission: EM | Admit: 2017-10-23 | Discharge: 2017-10-23 | Disposition: A | Discharge status: Home / Self Care | Payer: Medicaid Other | Attending: Emergency Medicine | Admitting: Emergency Medicine

## 2017-10-23 DIAGNOSIS — F17210 Nicotine dependence, cigarettes, uncomplicated: Secondary | ICD-10-CM | POA: Diagnosis not present

## 2017-10-23 DIAGNOSIS — Z79899 Other long term (current) drug therapy: Secondary | ICD-10-CM | POA: Insufficient documentation

## 2017-10-23 DIAGNOSIS — L03211 Cellulitis of face: Secondary | ICD-10-CM | POA: Diagnosis not present

## 2017-10-23 DIAGNOSIS — R220 Localized swelling, mass and lump, head: Secondary | ICD-10-CM | POA: Diagnosis present

## 2017-10-23 DIAGNOSIS — J45909 Unspecified asthma, uncomplicated: Secondary | ICD-10-CM | POA: Insufficient documentation

## 2017-10-23 MED ORDER — IBUPROFEN 600 MG PO TABS
600.0000 mg | ORAL_TABLET | Freq: Once | ORAL | Status: AC
  Administered 2017-10-23: 600 mg via ORAL
  Filled 2017-10-23: qty 1

## 2017-10-23 MED ORDER — CLINDAMYCIN HCL 300 MG PO CAPS: 300.0000 mg | ORAL_CAPSULE | Freq: Four times a day (QID) | ORAL | 0 refills | Status: AC

## 2017-10-23 MED ORDER — CLINDAMYCIN HCL 150 MG PO CAPS
300.0000 mg | ORAL_CAPSULE | Freq: Once | ORAL | Status: AC
  Administered 2017-10-23: 300 mg via ORAL
  Filled 2017-10-23: qty 2

## 2017-10-23 NOTE — ED Provider Notes (Addendum)
[facility] Emergency Department Provider Note  Time seen: 11:33 AM  I have reviewed the triage vital signs and the nursing notes.   HISTORY  Chief Complaint Facial Swelling    HPI Kylie Schwartz is a 42 y.o. female with a past medical history of bipolar, gastric reflux, asthma, presents to the emergency department for facial swelling and 2 bumps.  Patient states Sunday morning she noticed 2 bumps to her forehead which were tender and a little swollen.  She went to her doctor Monday and was prescribed valacyclovir for possible shingles.  Has been taking the valacyclovir but today noticed that the 2 bumps on her forehead were slightly more swollen and tender and she had a mild amount of swelling around her nose as well.  Denies any fever.  Negative review of systems including vomiting.  Patient was concerned given the increase in swelling so she came to the emergency department for evaluation.   Past Medical History:  Diagnosis Date  . Asthma   . Bipolar disorder (HCC)   . Depression   . GERD (gastroesophageal reflux disease)     There are no active problems to display for this patient.   Past Surgical History:  Procedure Laterality Date  . CESAREAN SECTION    . CESAREAN SECTION    . COLONOSCOPY    . OSTECTOMY Right 03/09/2016   Procedure: OSTECTOMY/1st great toe;  Surgeon: Linus Galasodd Cline, DPM;  Location: [facility] ORS;  Service: Podiatry;  Laterality: Right;    Prior to Admission medications   Medication Sig Start Date End Date Taking? Authorizing Provider  albuterol (PROAIR HFA) 108 (90 Base) MCG/ACT inhaler Inhale 2 puffs into the lungs every 6 (six) hours as needed for wheezing or shortness of breath.    [provider]  beclomethasone (QVAR) 80 MCG/ACT inhaler Inhale 1 puff into the lungs 2 (two) times daily.    [provider]  busPIRone (BUSPAR) 15 MG tablet Take 15 mg by mouth 2 (two) times daily.    [provider]  cloNIDine  (CATAPRES) 0.1 MG tablet Take 0.1 mg by mouth 2 (two) times daily.    [provider]  esomeprazole (NEXIUM) 40 MG capsule Take 40 mg by mouth daily.    [provider]  HYDROcodone-acetaminophen (NORCO) 5-325 MG tablet Take 1-2 tablets by mouth every 4 (four) hours as needed for moderate pain. 03/09/16   Linus Galasline, Todd, DPM  ibuprofen (ADVIL,MOTRIN) 800 MG tablet Take 800 mg by mouth every 8 (eight) hours as needed.    [provider]  lamoTRIgine (LAMICTAL) 25 MG tablet Take 25 mg by mouth 4 (four) times daily.    [provider]  medroxyPROGESTERone (DEPO-PROVERA) 150 MG/ML injection Inject 150 mg into the muscle every 3 (three) months.    [provider]    No Known Allergies  History reviewed. No pertinent family history.  Social History Social History   Tobacco Use  . Smoking status: Current Some Day Smoker  . Smokeless tobacco: Never Used  Substance Use Topics  . Alcohol use: No  . Drug use: No    Review of Systems Constitutional: Negative for fever. Eyes: Negative for visual complaints.  Mild swelling around her nose. ENT: Negative for recent illness/congestion Cardiovascular: Negative for chest pain. Respiratory: Negative for shortness of breath. Gastrointestinal: Negative for abdominal pain, vomiting Genitourinary: Negative for urinary compaints Musculoskeletal: Negative for musculoskeletal complaints Skin: 2 bumps with tenderness and swelling to her forehead. Neurological: Mild headache. All  other ROS negative  ____________________________________________   PHYSICAL EXAM:  VITAL SIGNS: ED Triage Vitals  Enc Vitals Group     BP 10/23/17 1026 112/77     Pulse Rate 10/23/17 1026 78     Resp --      Temp 10/23/17 1026 97.9 F (36.6 C)     Temp Source 10/23/17 1026 Oral     SpO2 10/23/17 1026 98 %     Weight 10/23/17 1027 167 lb (75.8 kg)     Height 10/23/17 1027 5\' 7"  (1.702 m)     Head Circumference --      Peak  Flow --      Pain Score 10/23/17 1026 9     Pain Loc --      Pain Edu? --      Excl. in GC? --    Constitutional: Alert and oriented. Well appearing and in no distress. Eyes: Normal exam ENT   Head: Patient has 2 bumps to the her central to left forehead.  Appear to be more consistent with pimples/follicle inflammation.  No other rash identified.  No vesicular rash.  No skin tenderness.  Normal tympanic membrane.   Mouth/Throat: Mucous membranes are moist. Cardiovascular: Normal rate, regular rhythm. No murmur Respiratory: Normal respiratory effort without tachypnea nor retractions. Breath sounds are clear  Gastrointestinal: Soft and nontender. No distention. Musculoskeletal: Nontender with normal range of motion in all extremities.  Neurologic:  Normal speech and language. No gross focal neurologic deficits  Skin:  Skin is warm, dry.  2 small bumps as described above. Psychiatric: Mood and affect are normal  ____________________________________________   INITIAL IMPRESSION / ASSESSMENT AND PLAN / ED COURSE  Pertinent labs & imaging results that were available during my care of the patient were reviewed by me and considered in my medical decision making (see chart for details).  Patient presents to the emergency department with very slight swelling to her face and tenderness around 2 bumps to her central to left forehead.  Differential would include shingles, cellulitis, pimples.  Exam is more consistent with cellulitis.  No other rash identified.  No vesicular rash.  No dermatomal rash.  I discussed with the patient discontinuing valacyclovir and instead starting clindamycin.  Patient has very slight swelling around these 2 bumps with very minimal swelling extending to the bridge of her nose.  No visual changes.  No tenderness to this area.  I discussed using ice packs to the face for up to 10-20 minutes every several hours for comfort and to reduce swelling.  I also discussed  beginning clindamycin today taking it 4 times daily with follow-up in 2 days with her primary care doctor for recheck.  I also discussed return precautions for fever, vomiting or significant increase in swelling or pain.  ____________________________________________   FINAL CLINICAL IMPRESSION(S) / ED DIAGNOSES  Cellulitis    Minna Antis, MD 10/23/17 1142    Minna Antis, MD 10/23/17 [phone]

## 2017-10-23 NOTE — ED Triage Notes (Signed)
Pt to triage with two bumps on left forehead that started Sunday and were sore and itching. Went to PCP yesterday and was given valacyclovir for possible shingles. Woke up this am with left nose and cheek swelling.

## 2019-12-10 ENCOUNTER — Other Ambulatory Visit: Payer: Self-pay | Admitting: Physician Assistant

## 2019-12-10 DIAGNOSIS — Z1231 Encounter for screening mammogram for malignant neoplasm of breast: Secondary | ICD-10-CM

## 2019-12-16 ENCOUNTER — Ambulatory Visit
Admission: RE | Admit: 2019-12-16 | Discharge: 2019-12-16 | Disposition: A | Discharge status: Home / Self Care | Payer: Medicaid Other | Source: Ambulatory Visit | Attending: Physician Assistant | Admitting: Physician Assistant

## 2019-12-16 DIAGNOSIS — Z1231 Encounter for screening mammogram for malignant neoplasm of breast: Secondary | ICD-10-CM | POA: Diagnosis present

## 2020-04-28 ENCOUNTER — Other Ambulatory Visit: Payer: Self-pay

## 2020-04-28 ENCOUNTER — Emergency Department
Admission: EM | Admit: 2020-04-28 | Discharge: 2020-04-28 | Disposition: A | Discharge status: Home / Self Care | Payer: Medicaid Other | Attending: Emergency Medicine | Admitting: Emergency Medicine

## 2020-04-28 DIAGNOSIS — F17210 Nicotine dependence, cigarettes, uncomplicated: Secondary | ICD-10-CM | POA: Diagnosis not present

## 2020-04-28 DIAGNOSIS — J45909 Unspecified asthma, uncomplicated: Secondary | ICD-10-CM | POA: Diagnosis not present

## 2020-04-28 DIAGNOSIS — R42 Dizziness and giddiness: Secondary | ICD-10-CM | POA: Diagnosis not present

## 2020-04-28 LAB — BASIC METABOLIC PANEL
Anion gap: 6 (ref 5–15)
BUN: 10 mg/dL (ref 6–20)
CO2: 27 mmol/L (ref 22–32)
Calcium: 9 mg/dL (ref 8.9–10.3)
Chloride: 103 mmol/L (ref 98–111)
Creatinine, Ser: 0.82 mg/dL (ref 0.44–1.00)
GFR calc Af Amer: >60 mL/min (ref >60)
GFR calc non Af Amer: >60 mL/min (ref >60)
Glucose, Bld: 98 mg/dL (ref 70–99)
Potassium: 3.8 mmol/L (ref 3.5–5.1)
Sodium: 136 mmol/L (ref 135–145)

## 2020-04-28 LAB — CBC
HCT: 40.7 % (ref 36.0–46.0)
Hemoglobin: 13.6 g/dL (ref 12.0–15.0)
MCH: 30.3 pg (ref 26.0–34.0)
MCHC: 33.4 g/dL (ref 30.0–36.0)
MCV: 90.6 fL (ref 80.0–100.0)
Platelets: 228 10*3/uL (ref 150–400)
RBC: 4.49 MIL/uL (ref 3.87–5.11)
RDW: 12.5 % (ref 11.5–15.5)
WBC: 8.6 10*3/uL (ref 4.0–10.5)
nRBC: 0 % (ref 0.0–0.2)

## 2020-04-28 MED ORDER — MECLIZINE HCL 25 MG PO TABS: 25.0000 mg | ORAL_TABLET | Freq: Three times a day (TID) | ORAL | 0 refills | Status: AC | PRN

## 2020-04-28 MED ORDER — MECLIZINE HCL 25 MG PO TABS
25.0000 mg | ORAL_TABLET | Freq: Once | ORAL | Status: AC
  Administered 2020-04-28: 25 mg via ORAL
  Filled 2020-04-28: qty 1

## 2020-04-28 MED ORDER — SODIUM CHLORIDE 0.9% FLUSH: 3.0000 mL | Freq: Once | INTRAVENOUS | Status: DC

## 2020-04-28 NOTE — Discharge Instructions (Addendum)
Please seek medical attention for any high fevers, chest pain, shortness of breath, change in behavior, persistent vomiting, bloody stool or any other new or concerning symptoms.  

## 2020-04-28 NOTE — ED Provider Notes (Signed)
[facility] Emergency Department Provider Note  ____________________________________________   I have reviewed the triage vital signs and the nursing notes.   HISTORY  Chief Complaint Dizziness  History limited by: Not Limited   HPI Kylie Schwartz is a 44 y.o. female who presents to the emergency department today with primary complaint of dizziness.  The patient states that she started noticing some ringing in her left ear a few days ago.  The ringing has continued.  She has since developed dizziness.  She states it is most present when she bends over or switches positions.  She is unable to lie on the left side of her bed.  The patient denies any trauma to her ear.  She denies being exposed to any pressure gradient.  Patient denies any recent illness.  States she went to her primary care doctor who prescribed a nasal spray.   Records reviewed. Per medical record review patient has a history of asthma.  Past Medical History:  Diagnosis Date  . Asthma   . Bipolar disorder ([place])   . Depression   . GERD (gastroesophageal reflux disease)     There are no problems to display for this patient.   Past Surgical History:  Procedure Laterality Date  . CESAREAN SECTION    . CESAREAN SECTION    . COLONOSCOPY    . OSTECTOMY Right 03/09/2016   Procedure: OSTECTOMY/1st great toe;  Surgeon: Sharlotte Alamo, DPM;  Location: [facility] ORS;  Service: Podiatry;  Laterality: Right;    Prior to Admission medications   Medication Sig Start Date End Date Taking? Authorizing Provider  albuterol (PROAIR HFA) 108 (90 Base) MCG/ACT inhaler Inhale 2 puffs into the lungs every 6 (six) hours as needed for wheezing or shortness of breath.    [provider]  beclomethasone (QVAR) 80 MCG/ACT inhaler Inhale 1 puff into the lungs 2 (two) times daily.    [provider]  busPIRone (BUSPAR) 15 MG tablet Take 15 mg by mouth 2 (two) times daily.    [provider]   cloNIDine (CATAPRES) 0.1 MG tablet Take 0.1 mg by mouth 2 (two) times daily.    [provider]  esomeprazole (NEXIUM) 40 MG capsule Take 40 mg by mouth daily.    [provider]  HYDROcodone-acetaminophen (NORCO) 5-325 MG tablet Take 1-2 tablets by mouth every 4 (four) hours as needed for moderate pain. 03/09/16   Sharlotte Alamo, DPM  ibuprofen (ADVIL,MOTRIN) 800 MG tablet Take 800 mg by mouth every 8 (eight) hours as needed.    [provider]  lamoTRIgine (LAMICTAL) 25 MG tablet Take 25 mg by mouth 4 (four) times daily.    [provider]  medroxyPROGESTERone (DEPO-PROVERA) 150 MG/ML injection Inject 150 mg into the muscle every 3 (three) months.    [provider]    Allergies Patient has no known allergies.  Family History  Problem Relation Age of Onset  . BRCA 1/2 Maternal Aunt 74    Social History Social History   Tobacco Use  . Smoking status: Current Some Day Smoker  . Smokeless tobacco: Never Used  Vaping Use  . Vaping Use: Never used  Substance Use Topics  . Alcohol use: No  . Drug use: No    Review of Systems Constitutional: No fever/chills Eyes: No visual changes. ENT: Positive for ringing to left ear. Cardiovascular: Denies chest pain. Respiratory: Denies shortness of breath. Gastrointestinal: No abdominal pain.  No nausea, no vomiting.  No diarrhea.  Genitourinary: Negative for dysuria. Musculoskeletal: Negative for back pain. Skin: Negative for rash. Neurological: Positive for dizziness. ____________________________________________   PHYSICAL EXAM:  VITAL SIGNS: ED Triage Vitals  Enc Vitals Group     BP 04/28/20 1654 109/78     Pulse Rate 04/28/20 1654 72     Resp 04/28/20 1654 18     Temp 04/28/20 1654 98 F (36.7 C)     Temp Source 04/28/20 1654 Oral     SpO2 04/28/20 1654 100 %     Weight 04/28/20 1655 166 lb (75.3 kg)     Height 04/28/20 1655 5' 7"  (1.702 m)     Head Circumference --      Peak  Flow --      Pain Score 04/28/20 1654 0   Constitutional: Alert and oriented.  Eyes: Conjunctivae are normal.  ENT      Head: Normocephalic and atraumatic.      Nose: No congestion/rhinnorhea.      Mouth/Throat: Mucous membranes are moist.      Ears: Bilateral TM without bulging or erythema. No rupture.       Neck: No stridor. Hematological/Lymphatic/Immunilogical: No cervical lymphadenopathy. Cardiovascular: Normal rate, regular rhythm.  No murmurs, rubs, or gallops.  Respiratory: Normal respiratory effort without tachypnea nor retractions. Breath sounds are clear and equal bilaterally. No wheezes/rales/rhonchi. Gastrointestinal: Soft and non tender. No rebound. No guarding.  Genitourinary: Deferred Musculoskeletal: Normal range of motion in all extremities. No lower extremity edema. Neurologic:  Normal speech and language. No gross focal neurologic deficits are appreciated.  Skin:  Skin is warm, dry and intact. No rash noted. Psychiatric: Mood and affect are normal. Speech and behavior are normal. Patient exhibits appropriate insight and judgment.  ____________________________________________    LABS (pertinent positives/negatives)  CBC wbc 8.6, hgb 13.6, plt 228 BMP wnl  ____________________________________________   EKG  I, Nance Pear, attending physician, personally viewed and interpreted this EKG  EKG Time: 1655 Rate: 60 Rhythm: normal sinus rhythm Axis: normal Intervals: qtc 406 QRS: incomplete RBBB ST changes: no st elevation Impression: abnormal ekg  ____________________________________________    RADIOLOGY  None  ____________________________________________   PROCEDURES  Procedures  ____________________________________________   INITIAL IMPRESSION / ASSESSMENT AND PLAN / ED COURSE  Pertinent labs & imaging results that were available during my care of the patient were reviewed by me and considered in my medical decision making (see chart  for details).   Patient presented to the emergency department today because of concerns for left ear ringing and problems with dizziness.  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  She did get relief from meclizine.  At this time I doubt a central lesion.  Do think is reasonable to discharge patient follow-up with ENT. Discussed plan with patient.  ___________________________________________   FINAL CLINICAL IMPRESSION(S) / ED DIAGNOSES  Final diagnoses:  Vertigo     Note: This dictation was prepared with Dragon dictation. Any transcriptional errors that result from this process are unintentional     Nance Pear, MD 04/28/20 2234

## 2020-04-28 NOTE — ED Triage Notes (Signed)
Pt arrives via POV for reports of left ear buzzing since last week. Reports she was prescribed a nasal spray on Friday by PCP which she has been using without success. Pt reports dizziness when waking up in the morning and having to hold onto things to get to the bathroom. Pt reports she was told that her "eardrum was protruding". Pt in NAD, ambulatory from triage with steady gait.

## 2020-11-11 ENCOUNTER — Other Ambulatory Visit: Payer: Self-pay | Admitting: Physician Assistant

## 2020-11-11 DIAGNOSIS — Z1231 Encounter for screening mammogram for malignant neoplasm of breast: Secondary | ICD-10-CM

## 2020-12-16 ENCOUNTER — Other Ambulatory Visit: Payer: Self-pay

## 2020-12-16 ENCOUNTER — Ambulatory Visit
Admission: RE | Admit: 2020-12-16 | Discharge: 2020-12-16 | Disposition: A | Discharge status: Home / Self Care | Payer: Medicaid Other | Source: Ambulatory Visit | Attending: Physician Assistant | Admitting: Physician Assistant

## 2020-12-16 DIAGNOSIS — Z1231 Encounter for screening mammogram for malignant neoplasm of breast: Secondary | ICD-10-CM | POA: Diagnosis not present

## 2021-11-20 ENCOUNTER — Other Ambulatory Visit: Payer: Self-pay | Admitting: Physician Assistant

## 2022-01-03 ENCOUNTER — Other Ambulatory Visit: Payer: Self-pay | Admitting: Physician Assistant

## 2022-01-03 DIAGNOSIS — Z1231 Encounter for screening mammogram for malignant neoplasm of breast: Secondary | ICD-10-CM

## 2022-02-07 ENCOUNTER — Ambulatory Visit
Admission: RE | Admit: 2022-02-07 | Discharge: 2022-02-07 | Disposition: A | Discharge status: Home / Self Care | Payer: Medicaid Other | Source: Ambulatory Visit | Attending: Physician Assistant | Admitting: Physician Assistant

## 2022-02-07 DIAGNOSIS — Z1231 Encounter for screening mammogram for malignant neoplasm of breast: Secondary | ICD-10-CM | POA: Diagnosis present

## 2022-08-30 ENCOUNTER — Other Ambulatory Visit: Payer: Self-pay

## 2022-08-30 ENCOUNTER — Emergency Department
Admission: EM | Admit: 2022-08-30 | Discharge: 2022-08-30 | Disposition: A | Discharge status: Home / Self Care | Payer: Medicaid Other | Attending: Emergency Medicine | Admitting: Emergency Medicine

## 2022-08-30 ENCOUNTER — Emergency Department: Payer: Medicaid Other

## 2022-08-30 DIAGNOSIS — R0602 Shortness of breath: Secondary | ICD-10-CM | POA: Diagnosis not present

## 2022-08-30 DIAGNOSIS — R0789 Other chest pain: Secondary | ICD-10-CM

## 2022-08-30 DIAGNOSIS — J45909 Unspecified asthma, uncomplicated: Secondary | ICD-10-CM | POA: Insufficient documentation

## 2022-08-30 DIAGNOSIS — R079 Chest pain, unspecified: Secondary | ICD-10-CM | POA: Diagnosis present

## 2022-08-30 LAB — CBC
HCT: 40.4 % (ref 36.0–46.0)
Hemoglobin: 13.6 g/dL (ref 12.0–15.0)
MCH: 29.7 pg (ref 26.0–34.0)
MCHC: 33.7 g/dL (ref 30.0–36.0)
MCV: 88.2 fL (ref 80.0–100.0)
Platelets: 251 10*3/uL (ref 150–400)
RBC: 4.58 MIL/uL (ref 3.87–5.11)
RDW: 12.4 % (ref 11.5–15.5)
WBC: 6.8 10*3/uL (ref 4.0–10.5)
nRBC: 0 % (ref 0.0–0.2)

## 2022-08-30 LAB — BASIC METABOLIC PANEL
Anion gap: 7 (ref 5–15)
BUN: 12 mg/dL (ref 6–20)
CO2: 25 mmol/L (ref 22–32)
Calcium: 9.3 mg/dL (ref 8.9–10.3)
Chloride: 108 mmol/L (ref 98–111)
Creatinine, Ser: 0.81 mg/dL (ref 0.44–1.00)
GFR, Estimated: >60 mL/min (ref >60)
Glucose, Bld: 92 mg/dL (ref 70–99)
Potassium: 4 mmol/L (ref 3.5–5.1)
Sodium: 140 mmol/L (ref 135–145)

## 2022-08-30 LAB — D-DIMER, QUANTITATIVE: D-Dimer, Quant: <0.27 ug/mL-FEU (ref 0.00–0.50)

## 2022-08-30 LAB — TROPONIN I (HIGH SENSITIVITY)
Troponin I (High Sensitivity): <2 ng/L (ref <18)
Troponin I (High Sensitivity): <2 ng/L (ref <18)

## 2022-08-30 LAB — POC URINE PREG, ED: Preg Test, Ur: NEGATIVE

## 2022-08-30 NOTE — ED Triage Notes (Signed)
C/O chest pain and SOB.  States symptoms have been ongoing since Sunday. Seen by primary care for same on Tuesday and given a cardiology referral.  AAOx3.  Skin warm and dry. No SOB? DOE.  NAD

## 2022-08-30 NOTE — ED Provider Notes (Signed)
[facility] Provider Note    Event Date/Time   First MD Initiated Contact with Patient 08/30/22 [phone]     (approximate)   History   Chest Pain   HPI  Kylie Schwartz is a 46 y.o. female  with history of asthma, GERD, anxiety, and bipolar disorder who presents with chest pain over the last week, intermittent course, sharp in quality, and occurring in different areas in her chest.  The pain is nonexertional.  She reports some associated mild shortness of breath at times but is not having any shortness of breath currently.  She denies any cough or fever.  She has no leg swelling.  Denies a prior history of this pain.  She has no history of diabetes, hypertension, or any heart problems.  Reviewed the past medical records.  The patient's most recent medical encounter was an ED visit in 2021 at [facility] for vertigo.  She has had no ED visits or admissions since that time.   Physical Exam   Triage Vital Signs: ED Triage Vitals  Enc Vitals Group     BP 08/30/22 0913 (!) 113/93     Pulse Rate 08/30/22 0913 65     Resp 08/30/22 0913 18     Temp 08/30/22 0913 97.6 F (36.4 C)     Temp Source 08/30/22 0913 Oral     SpO2 08/30/22 0913 100 %     Weight 08/30/22 0906 166 lb 0.1 oz (75.3 kg)     Height 08/30/22 0906 5\' 7"  (1.702 m)     Head Circumference --      Peak Flow --      Pain Score --      Pain Loc --      Pain Edu? --      Excl. in GC? --     Most recent vital signs: Vitals:   08/30/22 0913 08/30/22 1100  BP: (!) 113/93 105/75  Pulse: 65 65  Resp: 18 20  Temp: 97.6 F (36.4 C)   SpO2: 100% 100%    General: Awake, no distress.  CV:  Good peripheral perfusion.  Normal heart sounds. Resp:  Normal effort.  Lungs CTAB. Abd:  No distention.  Other:  No calf or popliteal swelling or tenderness.   ED Results / Procedures / Treatments   Labs (all labs ordered are listed, but only abnormal results are displayed) Labs Reviewed  BASIC METABOLIC  PANEL  CBC  D-DIMER, QUANTITATIVE  POC URINE PREG, ED  TROPONIN I (HIGH SENSITIVITY)  TROPONIN I (HIGH SENSITIVITY)     EKG  ED ECG REPORT I, 09/01/22, the attending physician, personally viewed and interpreted this ECG.  Date: 08/30/2022 EKG Time: 0911 Rate: 82 Rhythm: normal sinus rhythm QRS Axis: normal Intervals: Incomplete RBBB ST/T Wave abnormalities: normal Narrative Interpretation: no evidence of acute ischemia    RADIOLOGY  Chest x-ray: I independently viewed and interpreted the images; there is no focal consolidation or edema   PROCEDURES:  Critical Care performed: No  Procedures   MEDICATIONS ORDERED IN ED: Medications - No data to display   IMPRESSION / MDM / ASSESSMENT AND PLAN / ED COURSE  I reviewed the triage vital signs and the nursing notes.  46 year old female with PMH as noted above presents with atypical intermittent chest pain over the last week with some shortness of breath.  Physical exam is unremarkable for acute findings.  The patient is well-appearing with stable vital signs.  EKG is nonischemic.  Initial troponin is negative.  Differential diagnosis includes, but is not limited to, GERD, musculoskeletal pain, cervical radiculopathy, other benign etiology.  My suspicion for ACS is very low given the patient's age and lack of risk factors.  We will obtain a repeat troponin.  There is no clinical evidence for PE.  We will obtain a D-dimer to rule this out.  There is also no clinical evidence for aortic dissection or other vascular etiology given the intermittent nature and quality of the pain and the patient's lack of risk factors.  Patient's presentation is most consistent with acute presentation with potential threat to life or bodily function.  ----------------------------------------- 12:17 PM on 08/30/2022 -----------------------------------------  Repeat troponin and D-dimer are negative.  The patient is not having any  active pain.  She continues to be comfortable with stable vital signs.  She is stable for discharge home at this time.  I have provided cardiology referral.  I have given strict return precautions and she expresses understanding.   FINAL CLINICAL IMPRESSION(S) / ED DIAGNOSES   Final diagnoses:  Atypical chest pain     Rx / DC Orders   ED Discharge Orders          Ordered    Ambulatory referral to Cardiology       Comments: If you have not heard from the Cardiology office within the next 72 hours please call [phone].   08/30/22 1216             Note:  This document was prepared using Dragon voice recognition software and may include unintentional dictation errors.    Dionne Bucy, MD 08/30/22 1218

## 2022-08-30 NOTE — Discharge Instructions (Signed)
You can try taking Pepcid (famotidine) over-the-counter 20 mg once or twice daily; if your pain is due to acid reflux this will help.  Follow-up with a cardiologist.  We have provided a referral order as well.  Return to the ER for new, worsening, or persistent severe chest pain, difficulty breathing, weakness or lightheadedness, or any other new or worsening symptoms that concern you.

## 2022-09-07 ENCOUNTER — Encounter: Payer: Self-pay | Admitting: Cardiology

## 2022-09-07 ENCOUNTER — Ambulatory Visit: Payer: Medicaid Other | Attending: Cardiology | Admitting: Cardiology

## 2022-09-07 ENCOUNTER — Other Ambulatory Visit
Admission: RE | Admit: 2022-09-07 | Discharge: 2022-09-07 | Disposition: A | Discharge status: Home / Self Care | Payer: Medicaid Other | Source: Ambulatory Visit | Attending: Cardiology | Admitting: Cardiology

## 2022-09-07 VITALS — BP 112/80 | HR 72 | Ht 66.0 in | Wt 147.6 lb

## 2022-09-07 DIAGNOSIS — E785 Hyperlipidemia, unspecified: Secondary | ICD-10-CM

## 2022-09-07 DIAGNOSIS — R079 Chest pain, unspecified: Secondary | ICD-10-CM | POA: Diagnosis not present

## 2022-09-07 DIAGNOSIS — R072 Precordial pain: Secondary | ICD-10-CM

## 2022-09-07 LAB — BASIC METABOLIC PANEL
Anion gap: 6 (ref 5–15)
BUN: 11 mg/dL (ref 6–20)
CO2: 24 mmol/L (ref 22–32)
Calcium: 9.1 mg/dL (ref 8.9–10.3)
Chloride: 108 mmol/L (ref 98–111)
Creatinine, Ser: 0.78 mg/dL (ref 0.44–1.00)
GFR, Estimated: >60 mL/min (ref >60)
Glucose, Bld: 96 mg/dL (ref 70–99)
Potassium: 3.9 mmol/L (ref 3.5–5.1)
Sodium: 138 mmol/L (ref 135–145)

## 2022-09-07 MED ORDER — METOPROLOL TARTRATE 100 MG PO TABS: ORAL_TABLET | ORAL | 0 refills | Status: AC

## 2022-09-07 MED ORDER — IVABRADINE HCL 5 MG PO TABS: ORAL_TABLET | ORAL | 0 refills | Status: AC

## 2022-09-07 NOTE — Patient Instructions (Signed)
Medication Instructions:   Your physician recommends that you continue on your current medications as directed. Please refer to the Current Medication list given to you today.  *If you need a refill on your cardiac medications before your next appointment, please call your pharmacy*   Lab Work:  Your physician recommends you go to the medical mall to have lab work completed - BMP   If you have labs (blood work) drawn today and your tests are completely normal, you will receive your results only by: MyChart Message (if you have MyChart) OR A paper copy in the mail If you have any lab test that is abnormal or we need to change your treatment, we will call you to review the results.   Testing/Procedures:    Your cardiac CT will be scheduled at:   [facility] [address] [place], [place] [address] [phone]  please arrive 15 mins early for check-in and test prep.  Please follow these instructions carefully (unless otherwise directed):  On the Night Before the Test: Be sure to Drink plenty of water. Do not consume any caffeinated/decaffeinated beverages or chocolate 12 hours prior to your test. Do not take any antihistamines 12 hours prior to your test.  On the Day of the Test: Drink plenty of water until 1 hour prior to the test. Do not eat any food 1 hour prior to test. You may take your regular medications prior to the test.  Take metoprolol (Lopressor) 100 mg two hours prior to test. Taker Corlanor 10mg  two hours prior to test FEMALES- please wear underwire-free bra if available, avoid dresses & tight clothing      After the Test: Drink plenty of water. After receiving IV contrast, you may experience a mild flushed feeling. This is normal. On occasion, you may experience a mild rash up to 24 hours after the test. This is not dangerous. If this occurs, you can take Benadryl 25 mg and increase your fluid intake. If you  experience trouble breathing, this can be serious. If it is severe call 911 IMMEDIATELY. If it is mild, please call our office. If you take any of these medications: Glipizide/Metformin, Avandament, Glucavance, please do not take 48 hours after completing test unless otherwise instructed.  We will call to schedule your test 2-4 weeks out understanding that some insurance companies will need an authorization prior to the service being performed.   For non-scheduling related questions, please contact the cardiac imaging nurse navigator should you have any questions/concerns: , Cardiac Imaging Nurse Navigator [place], Cardiac Imaging Nurse Navigator [facility] Direct Office Dial: [phone]   For scheduling needs, including cancellations and rescheduling, please call [phone], [phone].  2. Echocardiogram   Your physician has requested that you have an echocardiogram. Echocardiography is a painless test that uses sound waves to create images of your heart. It provides your doctor with information about the size and shape of your heart and how well your heart's chambers and valves are working. This procedure takes approximately one hour. There are no restrictions for this procedure. Please note; depending on visual quality an IV may need to be placed.    Follow-Up: At [facility], you and your health needs are our priority.  As part of our continuing mission to provide you with exceptional heart care, we have created designated Provider Care Teams.  These Care Teams include your primary Cardiologist (physician) and Advanced Practice Providers (APPs -  Physician Assistants and Nurse Practitioners) who all work together to provide you with the care you need, when you need it.  We recommend signing up for the patient portal called "MyChart".  Sign up information is provided on this After Visit Summary.  MyChart is used to connect with  patients for Virtual Visits (Telemedicine).  Patients are able to view lab/test results, encounter notes, upcoming appointments, etc.  Non-urgent messages can be sent to your provider as well.   To learn more about what you can do with MyChart, go to [email or web address].    Your next appointment:   8 - 10 week(s)  The format for your next appointment:   In Person  Provider:   You may see Kate Sable, MD or one of the following Advanced Practice Providers on your designated Care Team:   Murray Hodgkins, NP Christell Faith, PA-C Cadence Kathlen Mody, PA-C Gerrie Nordmann, NP

## 2022-09-07 NOTE — Progress Notes (Signed)
Cardiology Office Note:    Date:  09/07/2022   ID:  ICYSS SKOG, DOB 1976-03-08, MRN [id number]  PCP:  [facility] Cardiologist:  Kate Sable, MD     Referring MD: Freddy Finner, NP   Chief Complaint  Patient presents with   New Patient (Initial Visit)    Chest pain, palpitations, SOB, pain in left     History of Present Illness:    Kylie Schwartz is a 46 y.o. female with a hx of hyperlipidemia, former smoker x 30 years, asthma, bipolar disorder, GERD who presents with chest pain.  States having symptoms of chest pain over the past 4 to 6 weeks.  Symptoms of chest pain which is located on left side and not associated with exertion or palpation.  Patient was at home about a week ago when symptoms began.  Lasted couple of minutes.  She drove herself to the ED, workup was unrevealing, advised to follow-up with cardiology.  Due to chest pain symptoms, she stopped smoking after 33 years.  Had another episode of chest pain earlier while at the grocery store, lasting a few minutes.  States having history of high cholesterol, mother also has high cholesterol, was told she did not need medications at this time per PCP, low-cholesterol diet advised which patient states has been adhering to.  Has occasional palpitations associated with anxiety.  Past Medical History:  Diagnosis Date   Asthma    Bipolar disorder ([place])    Depression    GERD (gastroesophageal reflux disease)     Past Surgical History:  Procedure Laterality Date   CESAREAN SECTION     CESAREAN SECTION     COLONOSCOPY     OSTECTOMY Right 03/09/2016   Procedure: OSTECTOMY/1st great toe;  Surgeon: Sharlotte Alamo, DPM;  Location: [facility] ORS;  Service: Podiatry;  Laterality: Right;    Current Medications: Current Meds  Medication Sig   beclomethasone (QVAR) 80 MCG/ACT inhaler Inhale 1 puff into the lungs 2 (two) times daily.   buPROPion (WELLBUTRIN XL) 300 MG  24 hr tablet Take 300 mg by mouth every morning.   busPIRone (BUSPAR) 15 MG tablet Take 15 mg by mouth 2 (two) times daily.   cloNIDine (CATAPRES) 0.1 MG tablet Take 0.1 mg by mouth 2 (two) times daily.   ibuprofen (ADVIL,MOTRIN) 800 MG tablet Take 800 mg by mouth every 8 (eight) hours as needed.   ivabradine (CORLANOR) 5 MG TABS tablet Take 2 tablets (71m) by mouth 2 hours prior to Cardiac CTA   lamoTRIgine (LAMICTAL) 25 MG tablet Take 25 mg by mouth 4 (four) times daily.   meclizine (ANTIVERT) 25 MG tablet Take 1 tablet (25 mg total) by mouth 3 (three) times daily as needed for dizziness.   metoprolol tartrate (LOPRESSOR) 100 MG tablet Take one tablet (10105m 2 hours prior to Cardiac CTA   OLANZapine (ZYPREXA) 10 MG tablet Take 10 mg by mouth at bedtime.     Allergies:   Patient has no known allergies.   Social History   Socioeconomic History   Marital status: Married    Spouse name: Not on file   Number of children: Not on file   Years of education: Not on file   Highest education level: Not on file  Occupational History   Not on file  Tobacco Use   Smoking status: Former    Types: Cigarettes    Quit date: 08/26/2022    Years since  quitting: 0.0   Smokeless tobacco: Never  Vaping Use   Vaping Use: Never used  Substance and Sexual Activity   Alcohol use: No   Drug use: No   Sexual activity: Not on file  Other Topics Concern   Not on file  Social History Narrative   Not on file   Social Determinants of Health   Financial Resource Strain: Not on file  Food Insecurity: Not on file  Transportation Needs: Not on file  Physical Activity: Not on file  Stress: Not on file  Social Connections: Not on file     Family History: The patient's family history includes BRCA 1/2 (age of onset: 36) in her maternal aunt; Breast cancer in her maternal aunt.  ROS:   Please see the history of present illness.     All other systems reviewed and are negative.  EKGs/Labs/Other  Studies Reviewed:    The following studies were reviewed today:   EKG:  EKG is  ordered today.  The ekg ordered today demonstrates normal sinus rhythm.  Recent Labs: 08/30/2022: Hemoglobin 13.6; Platelets 251 09/07/2022: BUN 11; Creatinine, Ser 0.78; Potassium 3.9; Sodium 138  Recent Lipid Panel No results found for: "CHOL", "TRIG", "HDL", "CHOLHDL", "VLDL", "LDLCALC", "LDLDIRECT"   Risk Assessment/Calculations:             Physical Exam:    VS:  BP 112/80 (BP Location: Right Arm)   Pulse 72   Ht _0  (1.676 m)   Wt 147 lb 9.6 oz (67 kg)   LMP 08/15/2022 (Exact Date)   SpO2 98%   BMI 23.82 kg/m     Wt Readings from Last 3 Encounters:  09/07/22 147 lb 9.6 oz (67 kg)  08/30/22 145 lb (65.8 kg)  04/28/20 166 lb (75.3 kg)     GEN:  Well nourished, well developed in no acute distress HEENT: Normal NECK: No JVD; No carotid bruits CARDIAC: RRR, no murmurs, rubs, gallops RESPIRATORY:  Clear to auscultation without rales, wheezing or rhonchi  ABDOMEN: Soft, non-tender, non-distended MUSCULOSKELETAL:  No edema; No deformity  SKIN: Warm and dry NEUROLOGIC:  Alert and oriented x 3 PSYCHIATRIC:  Normal affect   ASSESSMENT:    1. Precordial pain   2. Hyperlipidemia, unspecified hyperlipidemia type   3. Chest pain, unspecified type    PLAN:    In order of problems listed above:  Chest pain, risk factors smoking, hyperlipidemia.  Get echocardiogram, get coronary CTA. Hyperlipidemia, obtain fasting lipid profile results from PCP.  Continue low-cholesterol diet.  Follow-up after cardiac testing      Medication Adjustments/Labs and Tests Ordered: Current medicines are reviewed at length with the patient today.  Concerns regarding medicines are outlined above.  Orders Placed This Encounter  Procedures   CT CORONARY MORPH W/CTA COR W/SCORE W/CA W/CM &/OR WO/CM   Basic Metabolic Panel (BMET)   EKG 12-Lead   ECHOCARDIOGRAM COMPLETE   Meds ordered this encounter   Medications   metoprolol tartrate (LOPRESSOR) 100 MG tablet    Sig: Take one tablet (164m) 2 hours prior to Cardiac CTA    Dispense:  1 tablet    Refill:  0   ivabradine (CORLANOR) 5 MG TABS tablet    Sig: Take 2 tablets (143m by mouth 2 hours prior to Cardiac CTA    Dispense:  2 tablet    Refill:  0    Patient Instructions  Medication Instructions:   Your physician recommends that you continue on your current medications as  directed. Please refer to the Current Medication list given to you today.  *If you need a refill on your cardiac medications before your next appointment, please call your pharmacy*   Lab Work:  Your physician recommends you go to the medical mall to have lab work completed - BMP   If you have labs (blood work) drawn today and your tests are completely normal, you will receive your results only by: MyChart Message (if you have MyChart) OR A paper copy in the mail If you have any lab test that is abnormal or we need to change your treatment, we will call you to review the results.   Testing/Procedures:    Your cardiac CT will be scheduled at:   [facility] [address] [place], [place] [address] [phone]  please arrive 15 mins early for check-in and test prep.  Please follow these instructions carefully (unless otherwise directed):  On the Night Before the Test: Be sure to Drink plenty of water. Do not consume any caffeinated/decaffeinated beverages or chocolate 12 hours prior to your test. Do not take any antihistamines 12 hours prior to your test.  On the Day of the Test: Drink plenty of water until 1 hour prior to the test. Do not eat any food 1 hour prior to test. You may take your regular medications prior to the test.  Take metoprolol (Lopressor) 100 mg two hours prior to test. Taker Corlanor 61m two hours prior to test FEMALES- please wear underwire-free bra if available, avoid  dresses & tight clothing      After the Test: Drink plenty of water. After receiving IV contrast, you may experience a mild flushed feeling. This is normal. On occasion, you may experience a mild rash up to 24 hours after the test. This is not dangerous. If this occurs, you can take Benadryl 25 mg and increase your fluid intake. If you experience trouble breathing, this can be serious. If it is severe call 911 IMMEDIATELY. If it is mild, please call our office. If you take any of these medications: Glipizide/Metformin, Avandament, Glucavance, please do not take 48 hours after completing test unless otherwise instructed.  We will call to schedule your test 2-4 weeks out understanding that some insurance companies will need an authorization prior to the service being performed.   For non-scheduling related questions, please contact the cardiac imaging nurse navigator should you have any questions/concerns: SMarchia Bond Cardiac Imaging Nurse Navigator MGordy Clement Cardiac Imaging Nurse Navigator [facility] Direct Office Dial: [phone]  For scheduling needs, including cancellations and rescheduling, please call [place] [phone]  2. Echocardiogram   Your physician has requested that you have an echocardiogram. Echocardiography is a painless test that uses sound waves to create images of your heart. It provides your doctor with information about the size and shape of your heart and how well your heart's chambers and valves are working. This procedure takes approximately one hour. There are no restrictions for this procedure. Please note; depending on visual quality an IV may need to be placed.    Follow-Up: [facility] and your health needs are our priority.  As part of our continuing mission to provide you with exceptional heart care, we have created designated Provider Care Teams.  These Care Teams include your primary Cardiologist  (physician) and Advanced Practice Providers (APPs -  Physician Assistants and Nurse Practitioners) who all work together to provide you with  the care you need, when you need it.  We recommend signing up for the patient portal called "MyChart".  Sign up information is provided on this After Visit Summary.  MyChart is used to connect with patients for Virtual Visits (Telemedicine).  Patients are able to view lab/test results, encounter notes, upcoming appointments, etc.  Non-urgent messages can be sent to your provider as well.   To learn more about what you can do with MyChart, go to [email or web address].    Your next appointment:   8 - 10 week(s)  The format for your next appointment:   In Person  Provider:   You may see Kate Sable, MD or one of the following Advanced Practice Providers on your designated Care Team:   Murray Hodgkins, NP Christell Faith, PA-C Cadence Kathlen Mody, PA-C Gerrie Nordmann, NP    Signed, Kate Sable, MD  09/07/2022 11:55 AM    [place]

## 2022-09-10 ENCOUNTER — Encounter: Payer: Self-pay | Admitting: Cardiology

## 2022-10-03 ENCOUNTER — Telehealth (HOSPITAL_COMMUNITY): Payer: Self-pay | Admitting: *Deleted

## 2022-10-03 NOTE — Telephone Encounter (Signed)
Reaching out to patient to offer assistance regarding upcoming cardiac imaging study; pt verbalizes understanding of appt date/time, parking situation and where to check in, pre-test NPO status and medications ordered, and verified current allergies; name and call back number provided for further questions should they arise  Gordy Clement RN Navigator Cardiac Imaging [facility] [phone] office [phone] cell  Patient has concerns for her BP dropping on the medications. Advised patient to take 50mg  metoprolol tartrate and 10mg  ivabradine two hours prior to her cardiac CT scan.

## 2022-10-04 ENCOUNTER — Ambulatory Visit: Admission: RE | Admit: 2022-10-04 | Payer: Medicaid Other | Source: Ambulatory Visit

## 2022-10-23 ENCOUNTER — Telehealth (HOSPITAL_COMMUNITY): Payer: Self-pay | Admitting: Emergency Medicine

## 2022-10-23 NOTE — Telephone Encounter (Signed)
Reaching out to patient to offer assistance regarding upcoming cardiac imaging study; pt verbalizes understanding of appt date/time, parking situation and where to check in, pre-test NPO status and medications ordered, and verified current allergies; name and call back number provided for further questions should they arise Marchia Bond RN Navigator Cardiac Imaging [facility] [phone] office [phone] cell  Arrival [address] [facility] Daily meds + 100mg  metoprolol + 10mg  ivabradine Denies iv issues  Aware contrast/nitro

## 2022-10-25 ENCOUNTER — Ambulatory Visit
Admission: RE | Admit: 2022-10-25 | Discharge: 2022-10-25 | Disposition: A | Discharge status: Home / Self Care | Payer: Medicaid Other | Source: Ambulatory Visit | Attending: Cardiology | Admitting: Cardiology

## 2022-10-25 DIAGNOSIS — R079 Chest pain, unspecified: Secondary | ICD-10-CM

## 2022-10-25 MED ORDER — NITROGLYCERIN 0.4 MG SL SUBL
0.8000 mg | SUBLINGUAL_TABLET | Freq: Once | SUBLINGUAL | Status: AC
  Administered 2022-10-25: 0.8 mg via SUBLINGUAL

## 2022-10-25 MED ORDER — IOHEXOL 350 MG/ML SOLN
75.0000 mL | Freq: Once | INTRAVENOUS | Status: AC | PRN
  Administered 2022-10-25: 75 mL via INTRAVENOUS

## 2022-10-25 NOTE — Progress Notes (Signed)
Patient tolerated CT well. Drank water after. Vital signs stable encourage to drink water throughout day.Reasons explained and verbalized understanding. Ambulated steady gait.  

## 2022-10-26 ENCOUNTER — Ambulatory Visit: Payer: Medicaid Other | Attending: Cardiology

## 2022-11-06 ENCOUNTER — Telehealth: Payer: Self-pay | Admitting: Cardiology

## 2022-11-06 NOTE — Telephone Encounter (Signed)
LMOV to reschedule follow up til after ECHO

## 2022-11-06 NOTE — Telephone Encounter (Signed)
-----   Message from Janan Ridge, [place] sent at 11/06/2022  9:40 AM EST ----- Regarding: FW: 11-09-2022 appointment Can we push follow up appt back until after Echo please.  Thank you!  ----- Message ----- From: Quentin Angst, CMA Sent: 11/06/2022   8:52 AM EST To: Janan Ridge, CMA Subject: 11-09-2022 appointment                           Good Morning,   This patient is scheduled to see Dr. Mylo Red this Friday. She was last seen by Dr. Mylo Red on 09-07-22 and he stated that he wanted her to follow up after cardiac testing. She had her Cardiac Ct done on 1-25, but no showed her ECHO that was originally scheduled on 10-26-22. She was rescheduled for an ECHO on 12-19-22. Does she need to reschedule her appointment with Dr. Mylo Red until she has the ECHO or is she ok to see him this Friday?  Thank you so much,  Sherena.

## 2022-11-09 ENCOUNTER — Ambulatory Visit: Payer: Medicaid Other | Admitting: Cardiology

## 2022-11-12 ENCOUNTER — Ambulatory Visit: Payer: Medicaid Other | Admitting: Cardiology

## 2022-11-26 NOTE — Telephone Encounter (Signed)
LMOV to reschedule follow up after ECHO

## 2022-11-30 ENCOUNTER — Ambulatory Visit: Payer: Medicaid Other | Admitting: Cardiology

## 2022-12-19 ENCOUNTER — Ambulatory Visit: Payer: Medicaid Other | Attending: Cardiology

## 2022-12-19 DIAGNOSIS — R072 Precordial pain: Secondary | ICD-10-CM

## 2022-12-19 DIAGNOSIS — R079 Chest pain, unspecified: Secondary | ICD-10-CM

## 2022-12-19 LAB — ECHOCARDIOGRAM COMPLETE
AR max vel: 3.2 cm2
AV Area VTI: 3.01 cm2
AV Area mean vel: 2.99 cm2
AV Mean grad: 2 mmHg
AV Peak grad: 4.1 mmHg
Ao pk vel: 1.01 m/s
Area-P 1/2: 4.15 cm2
Calc EF: 56.5 %
S' Lateral: 3.5 cm
Single Plane A2C EF: 56.9 %
Single Plane A4C EF: 52.7 %

## 2023-01-15 ENCOUNTER — Ambulatory Visit: Payer: Medicaid Other | Attending: Cardiology | Admitting: Cardiology

## 2023-01-16 ENCOUNTER — Encounter: Payer: Self-pay | Admitting: Cardiology

## 2023-12-19 ENCOUNTER — Other Ambulatory Visit: Payer: Self-pay | Admitting: Primary Care

## 2023-12-19 DIAGNOSIS — Z1231 Encounter for screening mammogram for malignant neoplasm of breast: Secondary | ICD-10-CM
